# Patient Record
Sex: Female | Born: 1975 | Race: White | Hispanic: No | Marital: Single | Smoking: Current every day smoker
Health system: Southern US, Community
[De-identification: ages and names within clinical notes are randomized; demographics above are authoritative.]

## PROBLEM LIST (undated history)

## (undated) DIAGNOSIS — N920 Excessive and frequent menstruation with regular cycle: Secondary | ICD-10-CM

## (undated) DIAGNOSIS — Z87898 Personal history of other specified conditions: Secondary | ICD-10-CM

## (undated) DIAGNOSIS — S83519A Sprain of anterior cruciate ligament of unspecified knee, initial encounter: Secondary | ICD-10-CM

## (undated) DIAGNOSIS — N83209 Unspecified ovarian cyst, unspecified side: Secondary | ICD-10-CM

## (undated) DIAGNOSIS — Z8041 Family history of malignant neoplasm of ovary: Secondary | ICD-10-CM

## (undated) DIAGNOSIS — S83249A Other tear of medial meniscus, current injury, unspecified knee, initial encounter: Secondary | ICD-10-CM

## (undated) DIAGNOSIS — Z87440 Personal history of urinary (tract) infections: Secondary | ICD-10-CM

## (undated) DIAGNOSIS — D509 Iron deficiency anemia, unspecified: Secondary | ICD-10-CM

## (undated) HISTORY — DX: Personal history of other specified conditions: Z87.898

## (undated) HISTORY — PX: NO PAST SURGERIES: SHX2092

## (undated) HISTORY — DX: Family history of malignant neoplasm of ovary: Z80.41

---

## 2008-05-24 ENCOUNTER — Ambulatory Visit: Payer: Self-pay | Admitting: Family Medicine

## 2012-01-01 ENCOUNTER — Emergency Department: Payer: Self-pay | Admitting: Emergency Medicine

## 2012-11-22 ENCOUNTER — Emergency Department: Payer: Self-pay | Admitting: Emergency Medicine

## 2012-12-18 ENCOUNTER — Ambulatory Visit: Payer: Self-pay | Admitting: Specialist

## 2014-02-16 ENCOUNTER — Encounter: Payer: Self-pay | Admitting: Family Medicine

## 2014-02-16 ENCOUNTER — Ambulatory Visit (INDEPENDENT_AMBULATORY_CARE_PROVIDER_SITE_OTHER): Payer: BC Managed Care – PPO | Admitting: Family Medicine

## 2014-02-16 VITALS — BP 124/82 | HR 85 | Temp 98.8°F | Ht 64.25 in | Wt 158.8 lb

## 2014-02-16 DIAGNOSIS — M25569 Pain in unspecified knee: Secondary | ICD-10-CM

## 2014-02-16 DIAGNOSIS — Z8742 Personal history of other diseases of the female genital tract: Secondary | ICD-10-CM | POA: Insufficient documentation

## 2014-02-16 DIAGNOSIS — F17200 Nicotine dependence, unspecified, uncomplicated: Secondary | ICD-10-CM

## 2014-02-16 DIAGNOSIS — Z23 Encounter for immunization: Secondary | ICD-10-CM

## 2014-02-16 DIAGNOSIS — M25562 Pain in left knee: Secondary | ICD-10-CM | POA: Insufficient documentation

## 2014-02-16 MED ORDER — MELOXICAM 15 MG PO TABS: 15.0000 mg | ORAL_TABLET | Freq: Every day | ORAL | Status: DC

## 2014-02-16 NOTE — Progress Notes (Signed)
Subjective:    Patient ID: Ann Cox, female    DOB: 04-16-1976, 38 y.o.   MRN: [id number]  HPI Here to get est as new pt   She is a Scientist, research (medical)  She works for [employer]- down the road  Has not had a regular doctor in a while   She sees gyn at [place]   Has hx of depression  Was on cymbalta for 4 years - stopped it 8 mo ago and doing ok / saw a psychiatrist through [facility]   Has torn cartilage in L knee-has seen 2 orthopedic doctors   (has torn meniscus and also med collateral ligament?)- she needs surgery  The problem is she cannot be out of work for that long  It is getting worse with time  Original injury 2years ago - stepped in a whole  She really wants to put off the surgery -cannot afford it in addition to the time off work  A real problem - she stands for her job  She takes ibuprofen - otc 2-3 at a time perhaps 2-3 times per day  She wants stronger pain med    Pap- was 2 mo ago  Sees gyn - has hx of ovarian cysts  Her gyn did 2 saline ultrasounds less than a month ago  No children currently - and not sure if she will in the future  A hysterectomy was recommendation -once again she cannot afford surgery or the time off work for one     Td 2001-will update that today   Flu vaccine - did not get flu vaccine - she declines them   Smoking - 20 years 3/4 of a pack per day  Drinks a beer a week  Never tried to quit Not ready quit at this time She has tried the patches in the past and never used them   Has not been able to exercise due to her knee  Her Dr at [place] [facility] does her lab panel for wellness No hx of high chol or sugar   There are no active problems to display for this patient.  Past Medical History  Diagnosis Date  . History of chicken pox   . History of depression   . History of UTI    No past surgical history on file. History  Substance Use Topics  . Smoking status: Current Every Day Smoker -- 0.75 packs/day    Types:  Cigarettes  . Smokeless tobacco: Not on file  . Alcohol Use: Yes     Comment: occ   Family History  Problem Relation Age of Onset  . Adopted: Yes  . Alcohol abuse Maternal Uncle   . Alcohol abuse Paternal Uncle   . Arthritis Maternal Grandmother   . Rheum arthritis Paternal Uncle   . Arthritis Mother   . Arthritis Father   . Uterine cancer Maternal Aunt   . Hyperlipidemia Father   . Hyperlipidemia Maternal Grandfather   . Heart disease Maternal Grandmother   . Hypertension Mother   . Hypertension Father   . Anxiety disorder Mother   . Anxiety disorder Maternal Aunt   . Anxiety disorder Maternal Grandmother   . Depression Other     on mothers side   No Known Allergies No current outpatient prescriptions on file prior to visit.   No current facility-administered medications on file prior to visit.     Review of Systems Review of Systems  Constitutional: Negative for fever, appetite change,  fatigue and unexpected weight change.  Eyes: Negative for pain and visual disturbance.  Respiratory: Negative for cough and shortness of breath.   Cardiovascular: Negative for cp or palpitations    Gastrointestinal: Negative for nausea, diarrhea and constipation.  Genitourinary: Negative for urgency and frequency. pos for intermittent pelvic pain from ov cysts Skin: Negative for pallor or rash   MSK pos for severe knee pain and limited rom  Neurological: Negative for weakness, light-headedness, numbness and headaches.  Hematological: Negative for adenopathy. Does not bruise/bleed easily.  Psychiatric/Behavioral: Negative for dysphoric mood. The patient is not nervous/anxious.         Objective:   Physical Exam  Constitutional: She appears well-developed and well-nourished. No distress.  HENT:  Head: Normocephalic and atraumatic.  Mouth/Throat: Oropharynx is clear and moist.  Eyes: Conjunctivae and EOM are normal. Pupils are equal, round, and reactive to light. Right eye exhibits  no discharge. Left eye exhibits no discharge. No scleral icterus.  Neck: Normal range of motion. Neck supple. No JVD present. No thyromegaly present.  Cardiovascular: Normal rate, regular rhythm, normal heart sounds and intact distal pulses.  Exam reveals no gallop.   Pulmonary/Chest: Effort normal and breath sounds normal. No respiratory distress. She has no wheezes. She has no rales.  bs are slightly distant  Abdominal: Soft. Bowel sounds are normal. She exhibits no distension and no mass. There is no tenderness.  No suprapubic tenderness or fullness    Musculoskeletal: She exhibits tenderness. She exhibits no edema.  Unable to flex L knee more than about 50 deg due to pain  Medially tender  Puffy but no notable effusion  Gait is affected   Lymphadenopathy:    She has no cervical adenopathy.  Neurological: She is alert. She has normal reflexes. No cranial nerve deficit. She exhibits normal muscle tone. Coordination normal.  Skin: Skin is warm and dry. No rash noted. No erythema. No pallor.  Psychiatric: She has a normal mood and affect.          Assessment & Plan:

## 2014-02-16 NOTE — Progress Notes (Signed)
Pre visit review using our clinic review tool, if applicable. No additional management support is needed unless otherwise documented below in the visit note. 

## 2014-02-16 NOTE — Patient Instructions (Signed)
Tetanus vaccine today (Tdap)  Try the meloxicam for knee pain and swelling one pill daily with a meal  Tylenol is ok when you need it  - but do not mix with  other anti inflammatories (like ibuprofen and aleve) Please make an appointment with Dr Lorelei Pont at check out - he is our sport medicine doctor  Please send for the following records:   Last note and pap from [facility] gyn   Last labs from [place] note and imaging from Dr Lisette Grinder

## 2014-02-17 DIAGNOSIS — F17200 Nicotine dependence, unspecified, uncomplicated: Secondary | ICD-10-CM | POA: Insufficient documentation

## 2014-02-17 NOTE — Assessment & Plan Note (Signed)
Disc in detail risks of smoking and possible outcomes including copd, vascular/ heart disease, cancer , respiratory and sinus infections  Pt voices understanding  Pt is not ready to quit  

## 2014-02-17 NOTE — Assessment & Plan Note (Addendum)
Per pt - meniscal and ligament tear s/p 2 ortho opinions and needs surgery - but cannot afford surgery or time off work  Px meloxicam to use in place of ibuprofen  Adv tylenol as needed  Pt seemed disappointed not to get a "stronger" pain medicine  She is interested in a sport med ref to see if anything can be done non surgically to help pain Sent for last ortho records and imaging

## 2014-02-17 NOTE — Assessment & Plan Note (Signed)
Pt cannot take OC due to smoking  Sent for last gyn note and labs

## 2014-03-09 ENCOUNTER — Encounter: Payer: Self-pay | Admitting: Family Medicine

## 2014-03-09 ENCOUNTER — Ambulatory Visit (INDEPENDENT_AMBULATORY_CARE_PROVIDER_SITE_OTHER): Payer: BC Managed Care – PPO | Admitting: Family Medicine

## 2014-03-09 VITALS — BP 124/70 | HR 90 | Temp 98.5°F | Ht 64.25 in | Wt 159.5 lb

## 2014-03-09 DIAGNOSIS — S83419A Sprain of medial collateral ligament of unspecified knee, initial encounter: Secondary | ICD-10-CM

## 2014-03-09 DIAGNOSIS — IMO0002 Reserved for concepts with insufficient information to code with codable children: Secondary | ICD-10-CM

## 2014-03-09 DIAGNOSIS — S83509A Sprain of unspecified cruciate ligament of unspecified knee, initial encounter: Secondary | ICD-10-CM

## 2014-03-09 DIAGNOSIS — S83519A Sprain of anterior cruciate ligament of unspecified knee, initial encounter: Secondary | ICD-10-CM

## 2014-03-09 DIAGNOSIS — S83207A Unspecified tear of unspecified meniscus, current injury, left knee, initial encounter: Secondary | ICD-10-CM

## 2014-03-09 DIAGNOSIS — M25569 Pain in unspecified knee: Secondary | ICD-10-CM

## 2014-03-09 NOTE — Patient Instructions (Signed)
REFERRALS TO SPECIALISTS, SPECIAL TESTS (MRI, CT, ULTRASOUNDS)  GO THE WAITING ROOM AND TELL CHECK IN YOU NEED HELP WITH A REFERRAL. Either MARION or LINDA will help you set it up.  If it is between 1-2 PM they may be at lunch.  After 5 PM, they will likely be at home.  They will call you, so please make sure the office has your correct phone number.  Referrals sometimes can be done same day if urgent, but others can take 2 or 3 days to get an appointment. Starting in 2015, many of the new Medicare insurance plans and Affordable Care Act (Obamacare) Health plans offered take much longer for referrals. They have added additional paperwork and steps.  MRI's and CT's can take up to a week for the test. (Emergencies like strokes take precedence. I will tell you if you have an emergency.)   If your referral is to an in-network [facility] office, their office may contact you directly prior to our office reaching you.  -- Examples: [facility], [facility], [facility], [facility], [facility], and many more.  Specialist appointment times vary a great deal, mostly on the specialist's schedule and if they have openings. -- Our office tries to get you in as fast as possible. -- Some specialists have very long wait times. (Example. Dermatology. Usually months) -- If you have a true emergency like new cancer, we work to get you in ASAP.   

## 2014-03-09 NOTE — Progress Notes (Signed)
Pre visit review using our clinic review tool, if applicable. No additional management support is needed unless otherwise documented below in the visit note. 

## 2014-03-09 NOTE — Progress Notes (Signed)
Date:  03/09/2014   Name:  Ann Cox   DOB:  1976/04/01   MRN:  [id number]  Primary Physician: Loura Pardon, MD  Dear Dr. Glori Bickers,  Thank you for having me see Ann Cox in consultation today at [facility] at [facility] for her problem with [place].  As you may recall, she is a 38 y.o. year old female with a history of left knee pain over approaching 18 months. The patient was stepping in a hole at that time, and then she felt a significant amount of pain in her knee.  She saw initially Dr. Norville Haggard and the his partner Dr. Tamala Julian at [facility] She had a set of x-rays which she reports are unremarkable, and additionally she had an MRI of her left knee, and she had an ACL tear as well as a large bucket-handle  meniscal tear. She describes this as a cartilage tear.  She is still had some significant amount of intermittent effusions. Her knee is locking up intermittently and she has mechanical symptoms much of the time.  Both of the above surgeons recommended operative intervention, but they stated that she likely would be out of work for 6 months. Having now reviewed the notes, this has to do with her and the recommendation for anterior cruciate ligament reconstruction.   12/2012 - saw Ann Cox and also saw Dr. Norville Haggard.  Past Medical History  Diagnosis Date  . History of chicken pox   . History of depression   . History of UTI     No past surgical history on file.  History   Social History  . Marital Status: Single    Spouse Name: N/A    Number of Children: N/A  . Years of Education: N/A   Social History Main Topics  . Smoking status: Current Every Day Smoker -- 0.75 packs/day    Types: Cigarettes  . Smokeless tobacco: Never Used  . Alcohol Use: Yes     Comment: occ  . Drug Use: No  . Sexual Activity: None   Other Topics Concern  . None   Social History Narrative  . None    Family History  Problem Relation Age of Onset  . Adopted:  Yes  . Alcohol abuse Maternal Uncle   . Alcohol abuse Paternal Uncle   . Arthritis Maternal Grandmother   . Rheum arthritis Paternal Uncle   . Arthritis Mother   . Arthritis Father   . Uterine cancer Maternal Aunt   . Hyperlipidemia Father   . Hyperlipidemia Maternal Grandfather   . Heart disease Maternal Grandmother   . Hypertension Mother   . Hypertension Father   . Anxiety disorder Mother   . Anxiety disorder Maternal Aunt   . Anxiety disorder Maternal Grandmother   . Depression Other     on mothers side    Medications and Allergies reviewed  Review of Systems:    GEN: No fevers, chills. Nontoxic. Primarily MSK c/o today. MSK: Detailed in the HPI GI: tolerating PO intake without difficulty Neuro: No numbness, parasthesias, or tingling associated. Otherwise the pertinent positives of the ROS are noted above.   Physical Examination: BP 124/70  Pulse 90  Temp(Src) 98.5 F (36.9 C) (Oral)  Ht 5' 4.25" (1.632 m)  Wt 159 lb 8 oz (72.349 kg)  BMI 27.16 kg/m2  LMP 03/07/2014    GEN: WDWN, NAD, Non-toxic, Alert & Oriented x 3 HEENT: Atraumatic, Normocephalic.  Ears and Nose: No external deformity. EXTR: No  clubbing/cyanosis/edema NEURO: Normal gait.  PSYCH: Normally interactive. Conversant. Not depressed or anxious appearing.  Calm demeanor.    Left knee: The patient lacks 2 of extension. Flexion to 130. She does have a moderate ballotable effusion. There is some tenderness on the medial joint line greater than the lateral joint line.  All Lachman examination, there is an approximate 5 mm translation anteriorly compared to the contralateral side. This is also felt on the anterior drawer test. Posterior drawer test is negative.  McMurray's test is positive. Flexion pinch test is positive. Bounce home test is positive.  Objective Data: No results found.  Assessment and Plan: 1. Large bucket handle meniscal tear. 2. Left ACL rupture 3. Ongoing, routine mechanical  symptoms  Recommendations: 1. For now, ice and anti-inflammatories. Recommended that she wear her hinged knee brace, which sounds to be a playmaker brace equivalent. 2. Recommend surgical consultation. The patient has mechanical symptoms with a large meniscal tear on the left knee. She also now has an anterior cruciate ligament deficient knee. 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 She may do okay without anterior cruciate ligament reconstruction. I advised her that it would be certainly reasonable to have a second opinion on this case from a surgical standpoint. In my opinion, operative intervention would be the primary management in this case. I am going to consult Dr. Mardelle Matte for his opinion.   We will see the patient back prn only.  Thank you for having [place] see Ann Cox in consultation.  Feel free to contact me with any questions.  Updated Medication List:   Medication List       This list is accurate as of: 03/09/14 11:59 PM.  Always use your most recent med list.               meloxicam 15 MG tablet  Commonly known as:  MOBIC  Take 1 tablet (15 mg total) by mouth daily. Take it with food        Signed,  Isais Klipfel T. Youa Deloney, MD, [place] at [facility] [address] [place], [place] [address] Phone: [phone] Fax: [phone]

## 2014-03-10 DIAGNOSIS — S83519A Sprain of anterior cruciate ligament of unspecified knee, initial encounter: Secondary | ICD-10-CM

## 2014-03-10 HISTORY — DX: Sprain of anterior cruciate ligament of unspecified knee, initial encounter: S83.519A

## 2015-02-07 DIAGNOSIS — S83519A Sprain of anterior cruciate ligament of unspecified knee, initial encounter: Secondary | ICD-10-CM

## 2015-02-07 DIAGNOSIS — S83249A Other tear of medial meniscus, current injury, unspecified knee, initial encounter: Secondary | ICD-10-CM

## 2015-02-07 HISTORY — DX: Sprain of anterior cruciate ligament of unspecified knee, initial encounter: S83.519A

## 2015-02-07 HISTORY — DX: Other tear of medial meniscus, current injury, unspecified knee, initial encounter: S83.249A

## 2015-02-24 ENCOUNTER — Other Ambulatory Visit: Payer: Self-pay | Admitting: Orthopedic Surgery

## 2015-03-06 ENCOUNTER — Encounter (HOSPITAL_BASED_OUTPATIENT_CLINIC_OR_DEPARTMENT_OTHER): Payer: Self-pay | Admitting: *Deleted

## 2015-03-10 ENCOUNTER — Encounter (HOSPITAL_BASED_OUTPATIENT_CLINIC_OR_DEPARTMENT_OTHER)
Admission: RE | Disposition: A | Discharge status: Home / Self Care | Payer: Self-pay | Source: Ambulatory Visit | Attending: Orthopedic Surgery

## 2015-03-10 ENCOUNTER — Ambulatory Visit (HOSPITAL_BASED_OUTPATIENT_CLINIC_OR_DEPARTMENT_OTHER)
Admission: RE | Admit: 2015-03-10 | Discharge: 2015-03-10 | Disposition: A | Discharge status: Home / Self Care | Payer: BLUE CROSS/BLUE SHIELD | Source: Ambulatory Visit | Attending: Orthopedic Surgery | Admitting: Orthopedic Surgery

## 2015-03-10 ENCOUNTER — Ambulatory Visit (HOSPITAL_BASED_OUTPATIENT_CLINIC_OR_DEPARTMENT_OTHER): Payer: BLUE CROSS/BLUE SHIELD | Admitting: Certified Registered"

## 2015-03-10 ENCOUNTER — Encounter (HOSPITAL_BASED_OUTPATIENT_CLINIC_OR_DEPARTMENT_OTHER): Payer: Self-pay

## 2015-03-10 DIAGNOSIS — S83242A Other tear of medial meniscus, current injury, left knee, initial encounter: Secondary | ICD-10-CM | POA: Diagnosis not present

## 2015-03-10 DIAGNOSIS — S83512A Sprain of anterior cruciate ligament of left knee, initial encounter: Secondary | ICD-10-CM | POA: Insufficient documentation

## 2015-03-10 DIAGNOSIS — Y9389 Activity, other specified: Secondary | ICD-10-CM | POA: Insufficient documentation

## 2015-03-10 DIAGNOSIS — Y9289 Other specified places as the place of occurrence of the external cause: Secondary | ICD-10-CM | POA: Diagnosis not present

## 2015-03-10 DIAGNOSIS — Z87440 Personal history of urinary (tract) infections: Secondary | ICD-10-CM | POA: Diagnosis not present

## 2015-03-10 DIAGNOSIS — F17210 Nicotine dependence, cigarettes, uncomplicated: Secondary | ICD-10-CM | POA: Diagnosis not present

## 2015-03-10 DIAGNOSIS — X58XXXA Exposure to other specified factors, initial encounter: Secondary | ICD-10-CM | POA: Diagnosis not present

## 2015-03-10 DIAGNOSIS — Y998 Other external cause status: Secondary | ICD-10-CM | POA: Insufficient documentation

## 2015-03-10 DIAGNOSIS — S83519A Sprain of anterior cruciate ligament of unspecified knee, initial encounter: Secondary | ICD-10-CM | POA: Diagnosis present

## 2015-03-10 HISTORY — DX: Other tear of medial meniscus, current injury, unspecified knee, initial encounter: S83.249A

## 2015-03-10 HISTORY — DX: Sprain of anterior cruciate ligament of unspecified knee, initial encounter: S83.519A

## 2015-03-10 HISTORY — DX: Personal history of urinary (tract) infections: Z87.440

## 2015-03-10 HISTORY — PX: KNEE ARTHROSCOPY WITH MEDIAL MENISECTOMY: SHX5651

## 2015-03-10 HISTORY — PX: ARTHROSCOPY WITH ANTERIOR CRUCIATE LIGAMENT (ACL) REPAIR WITH ANTERIOR TIBILIAS GRAFT: SHX6503

## 2015-03-10 LAB — POCT HEMOGLOBIN-HEMACUE: Hemoglobin: 12.2 g/dL (ref 12.0–15.0)

## 2015-03-10 SURGERY — KNEE ARTHROSCOPY WITH ANTERIOR CRUCIATE LIGAMENT (ACL) REPAIR WITH ANTERIOR TIBILIAS GRAFT
Anesthesia: Regional | Site: Knee | Laterality: Left

## 2015-03-10 MED ORDER — KETOROLAC TROMETHAMINE 30 MG/ML IJ SOLN
30.0000 mg | Freq: Once | INTRAMUSCULAR | Status: AC
  Administered 2015-03-10: 30 mg via INTRAVENOUS

## 2015-03-10 MED ORDER — FENTANYL CITRATE 0.05 MG/ML IJ SOLN
INTRAMUSCULAR | Status: DC | PRN
  Administered 2015-03-10: 25 ug via INTRAVENOUS
  Administered 2015-03-10: 100 ug via INTRAVENOUS
  Administered 2015-03-10: 50 ug via INTRAVENOUS
  Administered 2015-03-10: 25 ug via INTRAVENOUS

## 2015-03-10 MED ORDER — HYDROMORPHONE HCL 1 MG/ML IJ SOLN
0.2500 mg | INTRAMUSCULAR | Status: DC | PRN
  Administered 2015-03-10 (×3): 0.5 mg via INTRAVENOUS

## 2015-03-10 MED ORDER — DEXAMETHASONE SODIUM PHOSPHATE 10 MG/ML IJ SOLN
INTRAMUSCULAR | Status: DC | PRN
  Administered 2015-03-10: 10 mg via INTRAVENOUS

## 2015-03-10 MED ORDER — OXYCODONE HCL 5 MG PO TABS
ORAL_TABLET | ORAL | Status: AC
Start: 2015-03-10 — End: 2015-03-10
  Filled 2015-03-10: qty 1

## 2015-03-10 MED ORDER — FENTANYL CITRATE 0.05 MG/ML IJ SOLN
INTRAMUSCULAR | Status: AC
  Filled 2015-03-10: qty 8

## 2015-03-10 MED ORDER — SENNA-DOCUSATE SODIUM 8.6-50 MG PO TABS: 2.0000 | ORAL_TABLET | Freq: Every day | ORAL | Status: DC

## 2015-03-10 MED ORDER — KETOROLAC TROMETHAMINE 30 MG/ML IJ SOLN
INTRAMUSCULAR | Status: AC
  Filled 2015-03-10: qty 1

## 2015-03-10 MED ORDER — HYDROMORPHONE HCL 1 MG/ML IJ SOLN
INTRAMUSCULAR | Status: AC
  Filled 2015-03-10: qty 1

## 2015-03-10 MED ORDER — BUPIVACAINE-EPINEPHRINE (PF) 0.5% -1:200000 IJ SOLN
INTRAMUSCULAR | Status: DC | PRN
  Administered 2015-03-10: 30 mL via PERINEURAL

## 2015-03-10 MED ORDER — ONDANSETRON HCL 4 MG/2ML IJ SOLN: 4.0000 mg | Freq: Four times a day (QID) | INTRAMUSCULAR | Status: DC | PRN

## 2015-03-10 MED ORDER — MIDAZOLAM HCL 2 MG/2ML IJ SOLN
INTRAMUSCULAR | Status: AC
  Filled 2015-03-10: qty 2

## 2015-03-10 MED ORDER — MIDAZOLAM HCL 2 MG/2ML IJ SOLN
1.0000 mg | INTRAMUSCULAR | Status: DC | PRN
  Administered 2015-03-10: 2 mg via INTRAVENOUS

## 2015-03-10 MED ORDER — OXYCODONE-ACETAMINOPHEN 5-325 MG PO TABS: 1.0000 | ORAL_TABLET | Freq: Four times a day (QID) | ORAL | Status: DC | PRN

## 2015-03-10 MED ORDER — BUPIVACAINE HCL (PF) 0.25 % IJ SOLN
INTRAMUSCULAR | Status: AC
  Filled 2015-03-10: qty 30

## 2015-03-10 MED ORDER — BACLOFEN 10 MG PO TABS: 10.0000 mg | ORAL_TABLET | Freq: Three times a day (TID) | ORAL | Status: DC

## 2015-03-10 MED ORDER — LIDOCAINE HCL (CARDIAC) 20 MG/ML IV SOLN
INTRAVENOUS | Status: DC | PRN
  Administered 2015-03-10: 30 mg via INTRAVENOUS

## 2015-03-10 MED ORDER — PROPOFOL 10 MG/ML IV BOLUS
INTRAVENOUS | Status: DC | PRN
  Administered 2015-03-10: 200 mg via INTRAVENOUS

## 2015-03-10 MED ORDER — ONDANSETRON HCL 4 MG PO TABS: 4.0000 mg | ORAL_TABLET | Freq: Three times a day (TID) | ORAL | Status: DC | PRN

## 2015-03-10 MED ORDER — FENTANYL CITRATE 0.05 MG/ML IJ SOLN
50.0000 ug | INTRAMUSCULAR | Status: DC | PRN
  Administered 2015-03-10: 100 ug via INTRAVENOUS

## 2015-03-10 MED ORDER — BUPIVACAINE-EPINEPHRINE (PF) 0.25% -1:200000 IJ SOLN
INTRAMUSCULAR | Status: AC
  Filled 2015-03-10: qty 30

## 2015-03-10 MED ORDER — CEFAZOLIN SODIUM-DEXTROSE 2-3 GM-% IV SOLR
2.0000 g | INTRAVENOUS | Status: AC
  Administered 2015-03-10: 2 g via INTRAVENOUS

## 2015-03-10 MED ORDER — OXYCODONE HCL 5 MG/5ML PO SOLN: 5.0000 mg | Freq: Once | ORAL | Status: AC | PRN

## 2015-03-10 MED ORDER — OXYCODONE HCL 5 MG PO TABS
5.0000 mg | ORAL_TABLET | Freq: Once | ORAL | Status: AC | PRN
  Administered 2015-03-10: 5 mg via ORAL

## 2015-03-10 MED ORDER — SODIUM CHLORIDE 0.9 % IR SOLN
Status: DC | PRN
  Administered 2015-03-10: 9000 mL

## 2015-03-10 MED ORDER — LACTATED RINGERS IV SOLN
INTRAVENOUS | Status: DC
  Administered 2015-03-10 (×2): via INTRAVENOUS
  Administered 2015-03-10: 10 mL/h via INTRAVENOUS

## 2015-03-10 MED ORDER — ONDANSETRON HCL 4 MG/2ML IJ SOLN
INTRAMUSCULAR | Status: DC | PRN
  Administered 2015-03-10: 4 mg via INTRAVENOUS

## 2015-03-10 MED ORDER — FENTANYL CITRATE 0.05 MG/ML IJ SOLN
INTRAMUSCULAR | Status: AC
  Filled 2015-03-10: qty 2

## 2015-03-10 MED ORDER — MIDAZOLAM HCL 5 MG/5ML IJ SOLN
INTRAMUSCULAR | Status: DC | PRN
  Administered 2015-03-10: 2 mg via INTRAVENOUS

## 2015-03-10 SURGICAL SUPPLY — 80 items
ANCHOR BUTTON TIGHTROPE ACL RT (Orthopedic Implant) ×3 IMPLANT
BANDAGE ELASTIC 6 VELCRO ST LF (GAUZE/BANDAGES/DRESSINGS) IMPLANT
BANDAGE ESMARK 6X9 LF (GAUZE/BANDAGES/DRESSINGS) IMPLANT
BLADE 4.2CUDA (BLADE) IMPLANT
BLADE CUDA GRT WHITE 3.5 (BLADE) ×3 IMPLANT
BLADE CUDA SHAVER 3.5 (BLADE) IMPLANT
BLADE CUTTER GATOR 3.5 (BLADE) IMPLANT
BLADE GREAT WHITE 4.2 (BLADE) IMPLANT
BLADE GREAT WHITE 4.2MM (BLADE)
BLADE SURG 15 STRL LF DISP TIS (BLADE) ×1 IMPLANT
BLADE SURG 15 STRL SS (BLADE) ×2
BNDG ESMARK 6X9 LF (GAUZE/BANDAGES/DRESSINGS)
BUR OVAL 6.0 (BURR) IMPLANT
CLOSURE STERI-STRIP 1/2X4 (GAUZE/BANDAGES/DRESSINGS) ×1
CLSR STERI-STRIP ANTIMIC 1/2X4 (GAUZE/BANDAGES/DRESSINGS) ×2 IMPLANT
COVER BACK TABLE 60X90IN (DRAPES) ×3 IMPLANT
CUFF TOURNIQUET SINGLE 34IN LL (TOURNIQUET CUFF) ×3 IMPLANT
CUTTER FLIP II 9.5MM (INSTRUMENTS) ×3 IMPLANT
CUTTER MENISCUS  4.2MM (BLADE)
CUTTER MENISCUS 4.2MM (BLADE) IMPLANT
DECANTER SPIKE VIAL GLASS SM (MISCELLANEOUS) IMPLANT
DRAPE ARTHROSCOPY W/POUCH 90 (DRAPES) ×3 IMPLANT
DRAPE OEC MINIVIEW 54X84 (DRAPES) ×6 IMPLANT
DRAPE U 20/CS (DRAPES) ×3 IMPLANT
DRAPE U-SHAPE 47X51 STRL (DRAPES) ×3 IMPLANT
DURAPREP 26ML APPLICATOR (WOUND CARE) ×3 IMPLANT
ELECT REM PT RETURN 9FT ADLT (ELECTROSURGICAL) ×3
ELECTRODE REM PT RTRN 9FT ADLT (ELECTROSURGICAL) ×1 IMPLANT
FIBERSTICK 2 (SUTURE) ×3 IMPLANT
GAUZE SPONGE 4X4 12PLY STRL (GAUZE/BANDAGES/DRESSINGS) ×3 IMPLANT
GLOVE BIO SURGEON STRL SZ 6.5 (GLOVE) ×4 IMPLANT
GLOVE BIO SURGEON STRL SZ8 (GLOVE) ×3 IMPLANT
GLOVE BIO SURGEONS STRL SZ 6.5 (GLOVE) ×2
GLOVE BIOGEL PI IND STRL 7.0 (GLOVE) ×1 IMPLANT
GLOVE BIOGEL PI IND STRL 8 (GLOVE) ×2 IMPLANT
GLOVE BIOGEL PI INDICATOR 7.0 (GLOVE) ×2
GLOVE BIOGEL PI INDICATOR 8 (GLOVE) ×4
GLOVE ORTHO TXT STRL SZ7.5 (GLOVE) ×3 IMPLANT
GOWN STRL REUS W/ TWL LRG LVL3 (GOWN DISPOSABLE) ×1 IMPLANT
GOWN STRL REUS W/ TWL XL LVL3 (GOWN DISPOSABLE) ×2 IMPLANT
GOWN STRL REUS W/TWL LRG LVL3 (GOWN DISPOSABLE) ×2
GOWN STRL REUS W/TWL XL LVL3 (GOWN DISPOSABLE) ×4
IMMOBILIZER KNEE 22 UNIV (SOFTGOODS) ×3 IMPLANT
IMMOBILIZER KNEE 24 THIGH 36 (MISCELLANEOUS) ×1 IMPLANT
IMMOBILIZER KNEE 24 UNIV (MISCELLANEOUS) ×3
IV NS IRRIG 3000ML ARTHROMATIC (IV SOLUTION) ×6 IMPLANT
KIT TRANSTIBIAL (DISPOSABLE) ×3 IMPLANT
KNEE WRAP E Z 3 GEL PACK (MISCELLANEOUS) ×3 IMPLANT
MANIFOLD NEPTUNE II (INSTRUMENTS) ×3 IMPLANT
NS IRRIG 1000ML POUR BTL (IV SOLUTION) ×3 IMPLANT
PACK ARTHROSCOPY DSU (CUSTOM PROCEDURE TRAY) ×3 IMPLANT
PACK BASIN DAY SURGERY FS (CUSTOM PROCEDURE TRAY) ×3 IMPLANT
PAD CAST 4YDX4 CTTN HI CHSV (CAST SUPPLIES) ×1 IMPLANT
PADDING CAST COTTON 4X4 STRL (CAST SUPPLIES) ×2
PADDING CAST COTTON 6X4 STRL (CAST SUPPLIES) ×3 IMPLANT
PENCIL BUTTON HOLSTER BLD 10FT (ELECTRODE) IMPLANT
SCREW BIO INTER 9X28 (Screw) ×3 IMPLANT
SET ARTHROSCOPY TUBING (MISCELLANEOUS) ×2
SET ARTHROSCOPY TUBING LN (MISCELLANEOUS) ×1 IMPLANT
SLEEVE SCD COMPRESS KNEE MED (MISCELLANEOUS) ×3 IMPLANT
SPONGE LAP 4X18 X RAY DECT (DISPOSABLE) ×3 IMPLANT
SUCTION FRAZIER TIP 10 FR DISP (SUCTIONS) ×3 IMPLANT
SUT 2 FIBERLOOP 20 STRT BLUE (SUTURE) ×6
SUT FIBERWIRE #2 38 T-5 BLUE (SUTURE)
SUT MNCRL AB 4-0 PS2 18 (SUTURE) ×3 IMPLANT
SUT VIC AB 0 CT1 27 (SUTURE)
SUT VIC AB 0 CT1 27XBRD ANBCTR (SUTURE) IMPLANT
SUT VIC AB 2-0 SH 27 (SUTURE)
SUT VIC AB 2-0 SH 27XBRD (SUTURE) IMPLANT
SUT VIC AB 3-0 SH 27 (SUTURE)
SUT VIC AB 3-0 SH 27X BRD (SUTURE) IMPLANT
SUT VICRYL 3-0 CR8 SH (SUTURE) IMPLANT
SUT VICRYL 4-0 PS2 18IN ABS (SUTURE) IMPLANT
SUTURE 2 FIBERLOOP 20 STRT BLU (SUTURE) ×2 IMPLANT
SUTURE FIBERWR #2 38 T-5 BLUE (SUTURE) IMPLANT
TENDON ANTERIOR TIBIALIS (Tissue) ×3 IMPLANT
TOWEL OR 17X24 6PK STRL BLUE (TOWEL DISPOSABLE) ×3 IMPLANT
TOWEL OR NON WOVEN STRL DISP B (DISPOSABLE) ×3 IMPLANT
WAND STAR VAC 90 (SURGICAL WAND) ×3 IMPLANT
WATER STERILE IRR 1000ML POUR (IV SOLUTION) ×3 IMPLANT

## 2015-03-10 NOTE — Transfer of Care (Signed)
Immediate Anesthesia Transfer of Care Note  Patient: Ann Cox  Procedure(s) Performed: Procedure(s): LEFT KNEE ARTHROSCOPY MEDIAL MENISCECTOMY  ANTERIOR CRUCIATE LIGAMENT (ACL) TIBIAL ANTERIOR ALLOGRAFT (Left)  Patient Location: PACU  Anesthesia Type:GA combined with regional for post-op pain  Level of Consciousness: awake, alert , oriented and patient cooperative  Airway & Oxygen Therapy: Patient Spontanous Breathing and Patient connected to face mask oxygen  Post-op Assessment: Report given to RN and Post -op Vital signs reviewed and stable  Post vital signs: Reviewed and stable  Last Vitals:  Filed Vitals:   03/10/15 0925  BP:   Pulse:   Temp:   Resp: 20    Complications: No apparent anesthesia complications

## 2015-03-10 NOTE — Progress Notes (Signed)
Assisted Dr. Marcie Bal with left, ultrasound guided, femoral block. Side rails up, monitors on throughout procedure. See vital signs in flow sheet. Tolerated Procedure well.

## 2015-03-10 NOTE — H&P (Signed)
PREOPERATIVE H&P  Chief Complaint: Left knee instability and pain  HPI: Ann Cox is a 39 y.o. female who presents for preoperative history and physical with a diagnosis of left anterior cruciate ligament tear with bucket handle medial meniscus tear. Symptoms are rated as moderate to severe, and have been worsening.  This is significantly impairing activities of daily living.  She has elected for surgical management. Pain gets up to be 10/10, been going on for 2-3 years, she works in retail, failed anti-inflammatories and activity modification. Recurrent episodes of instability and severe pain.  Past Medical History  Diagnosis Date  . ACL sprain 02/2015    left  . Medial meniscus tear 02/2015    left knee  . History of recurrent UTI (urinary tract infection)    Past Surgical History  Procedure Laterality Date  . No past surgeries     History   Social History  . Marital Status: Single    Spouse Name: N/A  . Number of Children: N/A  . Years of Education: N/A   Social History Main Topics  . Smoking status: Current Every Day Smoker -- 0.50 packs/day for 14 years    Types: Cigarettes  . Smokeless tobacco: Never Used  . Alcohol Use: Yes     Comment: occasionally  . Drug Use: No  . Sexual Activity: Not on file   Other Topics Concern  . None   Social History Narrative   Family History  Problem Relation Age of Onset  . Adopted: Yes  . Alcohol abuse Maternal Uncle   . Alcohol abuse Paternal Uncle   . Arthritis Maternal Grandmother   . Rheum arthritis Paternal Uncle   . Arthritis Mother   . Arthritis Father   . Uterine cancer Maternal Aunt   . Hyperlipidemia Father   . Hyperlipidemia Maternal Grandfather   . Heart disease Maternal Grandmother   . Hypertension Mother   . Hypertension Father   . Anxiety disorder Mother   . Anxiety disorder Maternal Aunt   . Anxiety disorder Maternal Grandmother    No Known Allergies Prior to Admission medications   Medication Sig  Start Date End Date Taking? Authorizing Provider  acetaminophen (TYLENOL) 325 MG tablet Take 650 mg by mouth every 6 (six) hours as needed.   Yes Historical Provider, MD     Positive ROS: All other systems have been reviewed and were otherwise negative with the exception of those mentioned in the HPI and as above.  Physical Exam: General: Alert, no acute distress Cardiovascular: No pedal edema Respiratory: No cyanosis, no use of accessory musculature GI: No organomegaly, abdomen is soft and non-tender Skin: No lesions in the area of chief complaint Neurologic: Sensation intact distally Psychiatric: Patient is competent for consent with normal mood and affect Lymphatic: No axillary or cervical lymphadenopathy  MUSCULOSKELETAL: Left knee has range of motion 0-130 with intact stability to varus and valgus, positive Lachman, medial joint line tenderness, negative lateral joint line tenderness.  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 We have also discussed the risks for recurrent instability, progression of posttraumatic arthritis, among others.  I doubt that the meniscus tear is repairable at this point, but we will assess this at the time intraoperatively.  Johnny Bridge, MD Cell [phone]   03/10/2015 7:31 AM

## 2015-03-10 NOTE — Op Note (Signed)
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  ANESTHESIA:   General  PREOPERATIVE INDICATIONS:  Ann Cox is a  39 y.o. female who tore her left anterior cruciate ligament and medial meniscus at least 3 years ago who failed conservative measures and elected for surgical management.    The risks benefits and alternatives were discussed with the patient preoperatively including but not limited to the risks of infection, bleeding, nerve injury, stiffness, cardiopulmonary complications, the need for revision surgery, recurrent instability, progression of arthritis, the potential for use of a allograft and related disease transmission risks, among others and the patient was willing to proceed.  .  OPERATIVE IMPLANTS: Arthrex anterior cruciate ligament tightrope, bio composite tibial interference screw size 9 mm and a 9.5 mm tibialis anterior allograft.  OPERATIVE FINDINGS: The anterior cruciate ligament was completely torn. The PCL was intact. The posterior lateral corner was intact to dial testing. Lateral compartment was normal. The patellofemoral joint was normal. The medial compartment meniscal findings demonstrated a large complex bucket-handle medial meniscus tear with a complex configuration into the posterior horn that was not amenable to repair. The tear extended around to the anterior horn. The medial femoral and tibial condyles were still in good condition.  OPERATIVE PROCEDURE: The patient  was brought to the operating room and placed in the supine position. General anesthesia was administered. IV antibiotics were given. The lower extremity was prepped and draped in usual sterile fashion. Exam under anesthesia demonstrated the above-named findings. Time out was performed.  Knee arthroscopy was then performed, and the above named findings were noted.     I used the arthroscopic shaver and the arthroscopic basket to remove the bucket handle complex tear, and trimmed back to a stable configuration. This was a near subtotal meniscectomy.  I then removed the previous anterior cruciate ligament stump, and performed a mild notchplasty.  The outside in guide was then applied to the appropriate position and the retro-cutter was used to drill the femoral socket. Care was taken to maintain the cortical bridge.  I then drilled the tibial tunnel using the retro-cutter, and opened the cortex with a reamer. All the soft tissue remnants were removed and cleaned at the aperture of the tunnel.she was fairly small, and the 9.5 mm graft completely filled the notch, despite a reasonable notchplasty, additionally the tibial tunnel was initially slightly more medial than I wanted it, and I repositioned this to be more central, but there really was not much room between the articular surfaces of the tibial condyles given her small stature.   I also dilated with the appropriate dilators.  The passing suture was delivered through the tibia, and then the button and graft delivered up into the femoral tunnel.  The button was flipped and confirmed under live fluoroscopy. I then tensioned the anterior cruciate ligament tightrope, and deliver the graft up into the femoral tunnel. Over 25 mm of graft was in the femoral tunnel. I confirmed once more with the fluoroscopy that the button was flipped appropriately on the femoral cortex.  I then cycled  the knee, eliminated all of the creep, and I had excellent isometry. I  then applied tension, and the Arthrex bio composite interference screw into the tibia placing a reverse Lachman maneuver on the tibia and femur. I removed the guide pin prior to completely seating the screw.  Excellent fixation was achieved on both the femoral and tibial side, and the wounds were irrigated copiously and the sartorius fascia repaired with Vicryl, and the portals repaired with Monocryl with Steri-Strips and sterile gauze.  The patient was awakened and returned to PACU in stable and satisfactory condition. There were no complications and She tolerated the procedure well.

## 2015-03-10 NOTE — Anesthesia Procedure Notes (Addendum)
Anesthesia Regional Block:  Femoral nerve block  Pre-Anesthetic Checklist: ,, timeout performed, Correct Patient, Correct Site, Correct Laterality, Correct Procedure,, site marked, risks and benefits discussed, Surgical consent,  Pre-op evaluation,  At surgeon's request and post-op pain management  Laterality: Left  Prep: chloraprep       Needles:  Injection technique: Single-shot  Needle Type: Echogenic Stimulator Needle     Needle Length: 9cm 9 cm Needle Gauge: 21 and 21 G    Additional Needles:  Procedures: nerve stimulator Femoral nerve block  Nerve Stimulator or Paresthesia:  Response: Quadriceps muscle contraction, 0.45 mA,   Additional Responses:   Narrative:  Start time: 03/10/2015 9:06 AM End time: 03/10/2015 9:18 AM Injection made incrementally with aspirations every 5 mL.  Performed by: Personally  Anesthesiologist: HODIERNE, ADAM  Additional Notes: Functioning IV was confirmed and monitors were applied.  A 27mm 21ga Arrow echogenic stimulator needle was used. Sterile prep and drape,hand hygiene and sterile gloves were used.  Negative aspiration and negative test dose prior to incremental administration of local anesthetic. The patient tolerated the procedure well.     Procedure Name: LMA Insertion Date/Time: 03/10/2015 9:36 AM Performed by: Catherine Oak D Pre-anesthesia Checklist: Patient identified, Emergency Drugs available, Suction available and Patient being monitored Patient Re-evaluated:Patient Re-evaluated prior to inductionOxygen Delivery Method: Circle System Utilized Preoxygenation: Pre-oxygenation with 100% oxygen Intubation Type: IV induction Ventilation: Mask ventilation without difficulty LMA: LMA inserted LMA Size: 4.0 Number of attempts: 1 Airway Equipment and Method: Bite block Placement Confirmation: positive ETCO2 Tube secured with: Tape Dental Injury: Teeth and Oropharynx as per pre-operative assessment

## 2015-03-10 NOTE — Anesthesia Preprocedure Evaluation (Signed)
Anesthesia Evaluation  Patient identified by MRN, date of birth, ID band Patient awake    Reviewed: Allergy & Precautions, NPO status , Patient's Chart, lab work & pertinent test results  Airway Mallampati: II   Neck ROM: full    Dental   Pulmonary Current Smoker,  breath sounds clear to auscultation        Cardiovascular negative cardio ROS  Rhythm:regular Rate:Normal     Neuro/Psych    GI/Hepatic   Endo/Other    Renal/GU      Musculoskeletal   Abdominal   Peds  Hematology   Anesthesia Other Findings   Reproductive/Obstetrics                             Anesthesia Physical Anesthesia Plan  ASA: II  Anesthesia Plan: General and Regional   Post-op Pain Management: MAC Combined w/ Regional for Post-op pain   Induction: Intravenous  Airway Management Planned: LMA  Additional Equipment:   Intra-op Plan:   Post-operative Plan:   Informed Consent: I have reviewed the patients History and Physical, chart, labs and discussed the procedure including the risks, benefits and alternatives for the proposed anesthesia with the patient or authorized representative who has indicated his/her understanding and acceptance.     Plan Discussed with: CRNA, Anesthesiologist and Surgeon  Anesthesia Plan Comments:         Anesthesia Quick Evaluation

## 2015-03-10 NOTE — Discharge Instructions (Signed)
Diet: As you were doing prior to hospitalization   Shower:  May shower but keep the wounds dry, use an occlusive plastic wrap, NO SOAKING IN TUB.  If the bandage gets wet, change with a clean dry gauze.  Dressing:  You may change your dressing 3-5 days after surgery.  Then change the dressing daily with sterile gauze dressing.    There are sticky tapes (steri-strips) on your wounds and all the stitches are absorbable.  Leave the steri-strips in place when changing your dressings, they will peel off with time, usually 2-3 weeks.  Activity:  Increase activity slowly as tolerated, but follow the weight bearing instructions below.  No lifting or driving for 6 weeks.  Weight Bearing:   As tolerated.    To prevent constipation: you may use a stool softener such as -  Colace (over the counter) 100 mg by mouth twice a day  Drink plenty of fluids (prune juice may be helpful) and high fiber foods Miralax (over the counter) for constipation as needed.    Itching:  If you experience itching with your medications, try taking only a single pain pill, or even half a pain pill at a time.  You may take up to 10 pain pills per day, and you can also use benadryl over the counter for itching or also to help with sleep.   Precautions:  If you experience chest pain or shortness of breath - call 911 immediately for transfer to the hospital emergency department!!  If you develop a fever greater that 101 F, purulent drainage from wound, increased redness or drainage from wound, or calf pain -- Call the office at [phone]                                                Follow- Up Appointment:  Please call for an appointment to be seen in 2 weeks [place] - [phone]  Regional Anesthesia Blocks  1. Numbness or the inability to move the "blocked" extremity may last from 3-48 hours after placement. The length of time depends on the medication injected and your individual response to the medication. If the  numbness is not going away after 48 hours, call your surgeon.  2. The extremity that is blocked will need to be protected until the numbness is gone and the  Strength has returned. Because you cannot feel it, you will need to take extra care to avoid injury. Because it may be weak, you may have difficulty moving it or using it. You may not know what position it is in without looking at it while the block is in effect.  3. For blocks in the legs and feet, returning to weight bearing and walking needs to be done carefully. You will need to wait until the numbness is entirely gone and the strength has returned. You should be able to move your leg and foot normally before you try and bear weight or walk. You will need someone to be with you when you first try to ensure you do not fall and possibly risk injury.  4. Bruising and tenderness at the needle site are common side effects and will resolve in a few days.  5. Persistent numbness or new problems with movement should be communicated to the surgeon or the [facility] [phone] [employer] [phone]).  Post Anesthesia Home Care Instructions  Activity: Get plenty of rest for the remainder of the day. A responsible adult should stay with you for 24 hours following the procedure.  For the next 24 hours, DO NOT: -Drive a car -Paediatric nurse -Drink alcoholic beverages -Take any medication unless instructed by your physician -Make any legal decisions or sign important papers.  Meals: Start with liquid foods such as gelatin or soup. Progress to regular foods as tolerated. Avoid greasy, spicy, heavy foods. If nausea and/or vomiting occur, drink only clear liquids until the nausea and/or vomiting subsides. Call your physician if vomiting continues.  Special Instructions/Symptoms: Your throat may feel dry or sore from the anesthesia or the breathing tube placed in your throat during surgery. If this causes  discomfort, gargle with warm salt water. The discomfort should disappear within 24 hours.  If you had a scopolamine patch placed behind your ear for the management of post- operative nausea and/or vomiting:  1. The medication in the patch is effective for 72 hours, after which it should be removed.  Wrap patch in a tissue and discard in the trash. Wash hands thoroughly with soap and water. 2. You may remove the patch earlier than 72 hours if you experience unpleasant side effects which may include dry mouth, dizziness or visual disturbances. 3. Avoid touching the patch. Wash your hands with soap and water after contact with the patch.

## 2015-03-10 NOTE — Anesthesia Postprocedure Evaluation (Signed)
Anesthesia Post Note  Patient: Ann Cox  Procedure(s) Performed: Procedure(s) (LRB): LEFT KNEE ARTHROSCOPY MEDIAL MENISCECTOMY  ANTERIOR CRUCIATE LIGAMENT (ACL) TIBIAL ANTERIOR ALLOGRAFT (Left)  Anesthesia type: General  Patient location: PACU  Post pain: Pain level controlled and Adequate analgesia  Post assessment: Post-op Vital signs reviewed, Patient's Cardiovascular Status Stable, Respiratory Function Stable, Patent Airway and Pain level controlled  Last Vitals:  Filed Vitals:   03/10/15 1245  BP: 112/66  Pulse: 69  Temp:   Resp: 12    Post vital signs: Reviewed and stable  Level of consciousness: awake, alert  and oriented  Complications: No apparent anesthesia complications

## 2015-03-14 ENCOUNTER — Encounter (HOSPITAL_BASED_OUTPATIENT_CLINIC_OR_DEPARTMENT_OTHER): Payer: Self-pay | Admitting: Orthopedic Surgery

## 2015-10-03 ENCOUNTER — Encounter: Payer: Self-pay | Admitting: Emergency Medicine

## 2015-10-03 ENCOUNTER — Emergency Department: Payer: BLUE CROSS/BLUE SHIELD

## 2015-10-03 ENCOUNTER — Emergency Department
Admission: EM | Admit: 2015-10-03 | Discharge: 2015-10-03 | Disposition: A | Discharge status: Home / Self Care | Payer: BLUE CROSS/BLUE SHIELD | Attending: Emergency Medicine | Admitting: Emergency Medicine

## 2015-10-03 DIAGNOSIS — Z79899 Other long term (current) drug therapy: Secondary | ICD-10-CM | POA: Diagnosis not present

## 2015-10-03 DIAGNOSIS — Z720 Tobacco use: Secondary | ICD-10-CM | POA: Insufficient documentation

## 2015-10-03 DIAGNOSIS — M25562 Pain in left knee: Secondary | ICD-10-CM | POA: Diagnosis not present

## 2015-10-03 MED ORDER — KETOROLAC TROMETHAMINE 60 MG/2ML IM SOLN
60.0000 mg | Freq: Once | INTRAMUSCULAR | Status: AC
  Administered 2015-10-03: 60 mg via INTRAMUSCULAR
  Filled 2015-10-03: qty 2

## 2015-10-03 MED ORDER — OXYCODONE-ACETAMINOPHEN 5-325 MG PO TABS: 1.0000 | ORAL_TABLET | ORAL | Status: DC | PRN

## 2015-10-03 MED ORDER — OXYCODONE-ACETAMINOPHEN 5-325 MG PO TABS
2.0000 | ORAL_TABLET | Freq: Once | ORAL | Status: AC
  Administered 2015-10-03: 2 via ORAL
  Filled 2015-10-03: qty 2

## 2015-10-03 NOTE — ED Notes (Signed)
States she had surgery to left knee in April . Now having increased pain unsure of new injury.  Min swelling noted

## 2015-10-03 NOTE — Discharge Instructions (Signed)
Cryotherapy °Cryotherapy means treatment with cold. Ice or gel packs can be used to reduce both pain and swelling. Ice is the most helpful within the first 24 to 48 hours after an injury or flare-up from overusing a muscle or joint. Sprains, strains, spasms, burning pain, shooting pain, and aches can all be eased with ice. Ice can also be used when recovering from surgery. Ice is effective, has very few side effects, and is safe for most people to use. °PRECAUTIONS  °Ice is not a safe treatment option for people with: °· Raynaud phenomenon. This is a condition affecting small blood vessels in the extremities. Exposure to cold may cause your problems to return. °· Cold hypersensitivity. There are many forms of cold hypersensitivity, including: °· Cold urticaria. Red, itchy hives appear on the skin when the tissues begin to warm after being iced. °· Cold erythema. This is a red, itchy rash caused by exposure to cold. °· Cold hemoglobinuria. Red blood cells break down when the tissues begin to warm after being iced. The hemoglobin that carry oxygen are passed into the urine because they cannot combine with blood proteins fast enough. °· Numbness or altered sensitivity in the area being iced. °If you have any of the following conditions, do not use ice until you have discussed cryotherapy with your caregiver: °· Heart conditions, such as arrhythmia, angina, or chronic heart disease. °· High blood pressure. °· Healing wounds or open skin in the area being iced. °· Current infections. °· Rheumatoid arthritis. °· Poor circulation. °· Diabetes. °Ice slows the blood flow in the region it is applied. This is beneficial when trying to stop inflamed tissues from spreading irritating chemicals to surrounding tissues. However, if you expose your skin to cold temperatures for too long or without the proper protection, you can damage your skin or nerves. Watch for signs of skin damage due to cold. °HOME CARE INSTRUCTIONS °Follow  these tips to use ice and cold packs safely. °· Place a dry or damp towel between the ice and skin. A damp towel will cool the skin more quickly, so you may need to shorten the time that the ice is used. °· For a more rapid response, add gentle compression to the ice. °· Ice for no more than 10 to 20 minutes at a time. The bonier the area you are icing, the less time it will take to get the benefits of ice. °· Check your skin after 5 minutes to make sure there are no signs of a poor response to cold or skin damage. °· Rest 20 minutes or more between uses. °· Once your skin is numb, you can end your treatment. You can test numbness by very lightly touching your skin. The touch should be so light that you do not see the skin dimple from the pressure of your fingertip. When using ice, most people will feel these normal sensations in this order: cold, burning, aching, and numbness. °· Do not use ice on someone who cannot communicate their responses to pain, such as small children or people with dementia. °HOW TO MAKE AN ICE PACK °Ice packs are the most common way to use ice therapy. Other methods include ice massage, ice baths, and cryosprays. Muscle creams that cause a cold, tingly feeling do not offer the same benefits that ice offers and should not be used as a substitute unless recommended by your caregiver. °To make an ice pack, do one of the following: °· Place crushed ice or a   bag of frozen vegetables in a sealable plastic bag. Squeeze out the excess air. Place this bag inside another plastic bag. Slide the bag into a pillowcase or place a damp towel between your skin and the bag.  Mix 3 parts water with 1 part rubbing alcohol. Freeze the mixture in a sealable plastic bag. When you remove the mixture from the freezer, it will be slushy. Squeeze out the excess air. Place this bag inside another plastic bag. Slide the bag into a pillowcase or place a damp towel between your skin and the bag. SEEK MEDICAL CARE  IF:  You develop white spots on your skin. This may give the skin a blotchy (mottled) appearance.  Your skin turns blue or pale.  Your skin becomes waxy or hard.  Your swelling gets worse. MAKE SURE YOU:   Understand these instructions.  Will watch your condition.  Will get help right away if you are not doing well or get worse.   This information is not intended to replace advice given to you by your health care provider. Make sure you discuss any questions you have with your health care provider.   Document Released: 07/22/2011 Document Revised: 12/16/2014 Document Reviewed: 07/22/2011 Elsevier Interactive Patient Education 2016 [place] therapy can help ease sore, stiff, injured, and tight muscles and joints. Heat relaxes your muscles, which may help ease your pain.  RISKS AND COMPLICATIONS If you have any of the following conditions, do not use heat therapy unless your health care provider has approved:  Poor circulation.  Healing wounds or scarred skin in the area being treated.  Diabetes, heart disease, or high blood pressure.  Not being able to feel (numbness) the area being treated.  Unusual swelling of the area being treated.  Active infections.  Blood clots.  Cancer.  Inability to communicate pain. This may include young children and people who have problems with their brain function (dementia).  Pregnancy. Heat therapy should only be used on old, pre-existing, or long-lasting (chronic) injuries. Do not use heat therapy on new injuries unless directed by your health care provider. HOW TO USE HEAT THERAPY There are several different kinds of heat therapy, including:  Moist heat pack.  Warm water bath.  Hot water bottle.  Electric heating pad.  Heated gel pack.  Heated wrap.  Electric heating pad. Use the heat therapy method suggested by your health care provider. Follow your health care provider's instructions on when  and how to use heat therapy. GENERAL HEAT THERAPY RECOMMENDATIONS  Do not sleep while using heat therapy. Only use heat therapy while you are awake.  Your skin may turn pink while using heat therapy. Do not use heat therapy if your skin turns red.  Do not use heat therapy if you have new pain.  High heat or long exposure to heat can cause burns. Be careful when using heat therapy to avoid burning your skin.  Do not use heat therapy on areas of your skin that are already irritated, such as with a rash or sunburn. SEEK MEDICAL CARE IF:  You have blisters, redness, swelling, or numbness.  You have new pain.  Your pain is worse. MAKE SURE YOU:  Understand these instructions.  Will watch your condition.  Will get help right away if you are not doing well or get worse.   This information is not intended to replace advice given to you by your health care provider. Make sure you discuss any questions you  have with your health care provider.   Document Released: 02/17/2012 Document Revised: 12/16/2014 Document Reviewed: 01/18/2014 Elsevier Interactive Patient Education 2016 [employer]  Knee Pain Knee pain is a very common symptom and can have many causes. Knee pain often goes away when you follow your health care provider's instructions for relieving pain and discomfort at home. However, knee pain can develop into a condition that needs treatment. Some conditions may include:  Arthritis caused by wear and tear (osteoarthritis).  Arthritis caused by swelling and irritation (rheumatoid arthritis or gout).  A cyst or growth in your knee.  An infection in your knee joint.  An injury that will not heal.  Damage, swelling, or irritation of the tissues that support your knee (torn ligaments or tendinitis). If your knee pain continues, additional tests may be ordered to diagnose your condition. Tests may include X-rays or other imaging studies of your knee. You may also need to have  fluid removed from your knee. Treatment for ongoing knee pain depends on the cause, but treatment may include:  Medicines to relieve pain or swelling.  Steroid injections in your knee.  Physical therapy.  Surgery. HOME CARE INSTRUCTIONS  Take medicines only as directed by your health care provider.  Rest your knee and keep it raised (elevated) while you are resting.  Do not do things that cause or worsen pain.  Avoid high-impact activities or exercises, such as running, jumping rope, or doing jumping jacks.  Apply ice to the knee area:  Put ice in a plastic bag.  Place a towel between your skin and the bag.  Leave the ice on for 20 minutes, 2-3 times a day.  Ask your health care provider if you should wear an elastic knee support.  Keep a pillow under your knee when you sleep.  Lose weight if you are overweight. Extra weight can put pressure on your knee.  Do not use any tobacco products, including cigarettes, chewing tobacco, or electronic cigarettes. If you need help quitting, ask your health care provider. Smoking may slow the healing of any bone and joint problems that you may have. SEEK MEDICAL CARE IF:  Your knee pain continues, changes, or gets worse.  You have a fever along with knee pain.  Your knee buckles or locks up.  Your knee becomes more swollen. SEEK IMMEDIATE MEDICAL CARE IF:   Your knee joint feels hot to the touch.  You have chest pain or trouble breathing.   This information is not intended to replace advice given to you by your health care provider. Make sure you discuss any questions you have with your health care provider.   Document Released: 09/22/2007 Document Revised: 12/16/2014 Document Reviewed: 07/11/2014 Elsevier Interactive Patient Education [employer].

## 2015-10-03 NOTE — ED Notes (Signed)
States she is having increased pain to left knee. Does a lot of walking at work but denies  Recent injury

## 2015-10-03 NOTE — ED Notes (Signed)
Discussed discharge instructions, prescriptions, and follow-up care with patient. No questions or concerns at this time. Pt stable at discharge.  

## 2015-10-03 NOTE — ED Provider Notes (Signed)
[facility] Emergency Department Provider Note  ____________________________________________  Time seen: Approximately 5:56 PM  I have reviewed the triage vital signs and the nursing notes.   HISTORY  Chief Complaint Knee Pain    HPI Ann Cox is a 39 y.o. female resents for evaluation of left knee pain. Patient states past medical history significant for anterior cruciate ligament repair and meniscus repair. States that for some reason the pain just started back today. Denies any known trauma.   Past Medical History  Diagnosis Date  . ACL sprain 02/2015    left  . Medial meniscus tear 02/2015    left knee  . History of recurrent UTI (urinary tract infection)   . ACL (anterior cruciate ligament) rupture 03/10/2014    Patient Active Problem List   Diagnosis Date Noted  . ACL (anterior cruciate ligament) rupture 03/10/2014  . Smoker 02/17/2014  . Left knee pain 02/16/2014  . History of ovarian cyst 02/16/2014    Past Surgical History  Procedure Laterality Date  . No past surgeries    . Arthroscopy with anterior cruciate ligament (acl) repair with anterior tibilias graft Left 03/10/2015    Procedure: LEFT KNEE ARTHROSCOPY MEDIAL MENISCECTOMY  ANTERIOR CRUCIATE LIGAMENT (ACL) TIBIAL ANTERIOR ALLOGRAFT;  Surgeon: Marchia Bond, MD;  Location: [place];  Service: Orthopedics;  Laterality: Left;  . Knee arthroscopy with medial menisectomy Left 03/10/2015    Procedure: KNEE ARTHROSCOPY WITH MEDIAL MENISECTOMY;  Surgeon: Marchia Bond, MD;  Location: [place];  Service: Orthopedics;  Laterality: Left;    Current Outpatient Rx  Name  Route  Sig  Dispense  Refill  . baclofen (LIORESAL) 10 MG tablet   Oral   Take 1 tablet (10 mg total) by mouth 3 (three) times daily. As needed for muscle spasm   50 tablet   0   . ondansetron (ZOFRAN) 4 MG tablet   Oral   Take 1 tablet (4 mg total) by mouth every 8 (eight) hours as  needed for nausea or vomiting.   30 tablet   0   . oxyCODONE-acetaminophen (ROXICET) 5-325 MG tablet   Oral   Take 1-2 tablets by mouth every 4 (four) hours as needed for severe pain.   24 tablet   0   . sennosides-docusate sodium (SENOKOT-S) 8.6-50 MG tablet   Oral   Take 2 tablets by mouth daily.   30 tablet   1     Allergies Review of patient's allergies indicates no known allergies.  Family History  Problem Relation Age of Onset  . Adopted: Yes  . Alcohol abuse Maternal Uncle   . Alcohol abuse Paternal Uncle   . Arthritis Maternal Grandmother   . Rheum arthritis Paternal Uncle   . Arthritis Mother   . Arthritis Father   . Uterine cancer Maternal Aunt   . Hyperlipidemia Father   . Hyperlipidemia Maternal Grandfather   . Heart disease Maternal Grandmother   . Hypertension Mother   . Hypertension Father   . Anxiety disorder Mother   . Anxiety disorder Maternal Aunt   . Anxiety disorder Maternal Grandmother     Social History Social History  Substance Use Topics  . Smoking status: Current Every Day Smoker -- 0.50 packs/day for 14 years    Types: Cigarettes  . Smokeless tobacco: Never Used  . Alcohol Use: Yes     Comment: occasionally    Review of Systems Constitutional: No fever/chills Eyes: No visual changes. ENT: No sore  throat. Cardiovascular: Denies chest pain. Respiratory: Denies shortness of breath. Gastrointestinal: No abdominal pain.  No nausea, no vomiting.  No diarrhea.  No constipation. Genitourinary: Negative for dysuria. Musculoskeletal: Positive for left knee pain. Skin: Negative for rash. Neurological: Negative for headaches, focal weakness or numbness.  10-point ROS otherwise negative.  ____________________________________________   PHYSICAL EXAM:  VITAL SIGNS: ED Triage Vitals  Enc Vitals Group     BP 10/03/15 1753 135/79 mmHg     Pulse Rate 10/03/15 1753 73     Resp 10/03/15 1753 18     Temp 10/03/15 1753 98.5 F (36.9 C)      Temp Source 10/03/15 1753 Oral     SpO2 10/03/15 1753 98 %     Weight 10/03/15 1753 160 lb (72.576 kg)     Height 10/03/15 1753 5\' 3"  (1.6 m)     Head Cir --      Peak Flow --      Pain Score --      Pain Loc --      Pain Edu? --      Excl. in [place]? --     Constitutional: Alert and oriented. Well appearing and in no acute distress.  Cardiovascular: Normal rate, regular rhythm. Grossly normal heart sounds.  Good peripheral circulation. Respiratory: Normal respiratory effort.  No retractions. Lungs CTAB. Gastrointestinal: Soft and nontender. No distention. No abdominal bruits. No CVA tenderness. Musculoskeletal: Left knee tenderness with no obvious effusion. Limited range of motion increased pain with extension and flexion. Neurologic:  Normal speech and language. No gross focal neurologic deficits are appreciated. No gait instability. Skin:  Skin is warm, dry and intact. No rash noted. Psychiatric: Mood and affect are normal. Speech and behavior are normal.  ____________________________________________   LABS (all labs ordered are listed, but only abnormal results are displayed)  Labs Reviewed - No data to display ____________________________________________    RADIOLOGY  Left knee negative for any acute effusion or knee abnormality. ____________________________________________   PROCEDURES  Procedure(s) performed: None  Critical Care performed: No  ____________________________________________   INITIAL IMPRESSION / ASSESSMENT AND PLAN / ED COURSE  Pertinent labs & imaging results that were available during my care of the patient were reviewed by me and considered in my medical decision making (see chart for details).  Left knee pain, acute. Past medical history of anterior cruciate ligament sprain with medial meniscus parent anterior cruciate ligament repair. Patient to follow up with orthopedic doctor for continuity of care. Rx given for Percocet 5/325. [place] controlled substance registry reviewed prior to dispensing. Patient voices no other emergency medical complaints at this time. Has crutches at home and will follow-up if needed. ____________________________________________   FINAL CLINICAL IMPRESSION(S) / ED DIAGNOSES  Final diagnoses:  Knee pain, left      Arlyss Repress, PA-C 10/03/15 1850  Orbie Pyo, MD 10/03/15 2214

## 2015-10-26 ENCOUNTER — Other Ambulatory Visit: Payer: Self-pay | Admitting: *Deleted

## 2015-10-26 ENCOUNTER — Inpatient Hospital Stay
Admission: RE | Admit: 2015-10-26 | Discharge: 2015-10-26 | Disposition: A | Discharge status: Home / Self Care | Payer: Self-pay | Source: Ambulatory Visit | Attending: *Deleted | Admitting: *Deleted

## 2015-10-26 ENCOUNTER — Other Ambulatory Visit: Payer: Self-pay | Admitting: Unknown Physician Specialty

## 2015-10-26 DIAGNOSIS — R928 Other abnormal and inconclusive findings on diagnostic imaging of breast: Secondary | ICD-10-CM

## 2015-10-26 DIAGNOSIS — Z9289 Personal history of other medical treatment: Secondary | ICD-10-CM

## 2015-11-09 DIAGNOSIS — Z1501 Genetic susceptibility to malignant neoplasm of breast: Secondary | ICD-10-CM

## 2015-11-09 DIAGNOSIS — Z1509 Genetic susceptibility to other malignant neoplasm: Secondary | ICD-10-CM

## 2015-11-09 DIAGNOSIS — Z1371 Encounter for nonprocreative screening for genetic disease carrier status: Secondary | ICD-10-CM

## 2015-11-09 HISTORY — DX: Genetic susceptibility to malignant neoplasm of breast: Z15.01

## 2015-11-09 HISTORY — DX: Genetic susceptibility to other malignant neoplasm: Z15.09

## 2015-11-09 HISTORY — DX: Encounter for nonprocreative screening for genetic disease carrier status: Z13.71

## 2015-11-14 ENCOUNTER — Ambulatory Visit
Admission: RE | Admit: 2015-11-14 | Discharge: 2015-11-14 | Disposition: A | Discharge status: Home / Self Care | Payer: BLUE CROSS/BLUE SHIELD | Source: Ambulatory Visit | Attending: Unknown Physician Specialty | Admitting: Unknown Physician Specialty

## 2015-11-14 DIAGNOSIS — N6489 Other specified disorders of breast: Secondary | ICD-10-CM | POA: Insufficient documentation

## 2015-11-14 DIAGNOSIS — R928 Other abnormal and inconclusive findings on diagnostic imaging of breast: Secondary | ICD-10-CM | POA: Diagnosis present

## 2016-01-18 ENCOUNTER — Inpatient Hospital Stay: Payer: BLUE CROSS/BLUE SHIELD | Admitting: Oncology

## 2016-12-16 ENCOUNTER — Other Ambulatory Visit: Payer: Self-pay | Admitting: Obstetrics & Gynecology

## 2016-12-16 DIAGNOSIS — N63 Unspecified lump in unspecified breast: Secondary | ICD-10-CM

## 2016-12-17 ENCOUNTER — Ambulatory Visit
Admission: RE | Admit: 2016-12-17 | Discharge: 2016-12-17 | Disposition: A | Discharge status: Home / Self Care | Payer: BLUE CROSS/BLUE SHIELD | Source: Ambulatory Visit | Attending: Obstetrics & Gynecology | Admitting: Obstetrics & Gynecology

## 2016-12-17 ENCOUNTER — Encounter: Payer: Self-pay | Admitting: General Surgery

## 2016-12-17 DIAGNOSIS — N63 Unspecified lump in unspecified breast: Secondary | ICD-10-CM

## 2016-12-19 ENCOUNTER — Ambulatory Visit (INDEPENDENT_AMBULATORY_CARE_PROVIDER_SITE_OTHER): Payer: BLUE CROSS/BLUE SHIELD | Admitting: General Surgery

## 2016-12-19 ENCOUNTER — Encounter: Payer: Self-pay | Admitting: General Surgery

## 2016-12-19 VITALS — BP 132/68 | HR 78 | Resp 12 | Ht 63.0 in | Wt 152.0 lb

## 2016-12-19 DIAGNOSIS — Z1501 Genetic susceptibility to malignant neoplasm of breast: Secondary | ICD-10-CM

## 2016-12-19 DIAGNOSIS — Z1589 Genetic susceptibility to other disease: Secondary | ICD-10-CM | POA: Diagnosis not present

## 2016-12-19 DIAGNOSIS — Z1509 Genetic susceptibility to other malignant neoplasm: Secondary | ICD-10-CM

## 2016-12-19 DIAGNOSIS — N611 Abscess of the breast and nipple: Secondary | ICD-10-CM | POA: Diagnosis not present

## 2016-12-19 MED ORDER — DOXYCYCLINE HYCLATE 100 MG PO TABS: 100.0000 mg | ORAL_TABLET | Freq: Two times a day (BID) | ORAL | 0 refills | Status: DC

## 2016-12-19 NOTE — Patient Instructions (Signed)
Return in one month.  

## 2016-12-19 NOTE — Progress Notes (Signed)
Patient ID: Ann Cox, female   DOB: 1976-10-18, 41 y.o.   MRN: [id number]  Chief Complaint  Patient presents with  . Other    HPI Ann Cox is a 41 y.o. female who presents for a breast evaluation. The most recent mammogram was done on 12/18/2015. BRCA  positive. She felt a lump in her left breast in  12/2015 and she felt it two weeks are her mammogram which was on 11/2015.The area has got bigger over the last couple weeks. A shooting pain through her nipple.  Patient does perform regular self breast checks and gets regular mammograms done.    HPI  Past Medical History:  Diagnosis Date  . ACL (anterior cruciate ligament) rupture 03/10/2014  . ACL sprain 02/2015   left  . History of recurrent UTI (urinary tract infection)   . Medial meniscus tear 02/2015   left knee    Past Surgical History:  Procedure Laterality Date  . ARTHROSCOPY WITH ANTERIOR CRUCIATE LIGAMENT (ACL) REPAIR WITH ANTERIOR TIBILIAS GRAFT Left 03/10/2015   Procedure: LEFT KNEE ARTHROSCOPY MEDIAL MENISCECTOMY  ANTERIOR CRUCIATE LIGAMENT (ACL) TIBIAL ANTERIOR ALLOGRAFT;  Surgeon: Marchia Bond, MD;  Location: [place];  Service: Orthopedics;  Laterality: Left;  . KNEE ARTHROSCOPY WITH MEDIAL MENISECTOMY Left 03/10/2015   Procedure: KNEE ARTHROSCOPY WITH MEDIAL MENISECTOMY;  Surgeon: Marchia Bond, MD;  Location: [place];  Service: Orthopedics;  Laterality: Left;  . NO PAST SURGERIES      Family History  Problem Relation Age of Onset  . Adopted: Yes  . Alcohol abuse Maternal Uncle   . Alcohol abuse Paternal Uncle   . Arthritis Maternal Grandmother   . Heart disease Maternal Grandmother   . Anxiety disorder Maternal Grandmother   . Rheum arthritis Paternal Uncle   . Arthritis Mother   . Hypertension Mother   . Anxiety disorder Mother   . Arthritis Father   . Hyperlipidemia Father   . Hypertension Father   . Uterine cancer Maternal Aunt   . Hyperlipidemia Maternal  Grandfather   . Breast cancer Maternal Grandfather   . Anxiety disorder Maternal Aunt   . Ovarian cancer Maternal Aunt     Social History Social History  Substance Use Topics  . Smoking status: Current Every Day Smoker    Packs/day: 0.50    Years: 14.00    Types: Cigarettes  . Smokeless tobacco: Never Used  . Alcohol use Yes     Comment: occasionally    No Known Allergies  Current Outpatient Prescriptions  Medication Sig Dispense Refill  . doxycycline (VIBRA-TABS) 100 MG tablet Take 1 tablet (100 mg total) by mouth 2 (two) times daily. 42 tablet 0   No current facility-administered medications for this visit.     Review of Systems Review of Systems  Constitutional: Negative.   Respiratory: Negative.   Cardiovascular: Negative.     Blood pressure 132/68, pulse 78, resp. rate 12, height _0  (1.6 m), weight 152 lb (68.9 kg), last menstrual period 12/08/2016.  Physical Exam Physical Exam  Constitutional: She is oriented to person, place, and time. She appears well-developed and well-nourished.  Eyes: Conjunctivae are normal. No scleral icterus.  Neck: Neck supple.  Cardiovascular: Normal rate and regular rhythm.   Murmur heard.  Systolic murmur is present with a grade of 1/6  Pulmonary/Chest: Effort normal and breath sounds normal. Right breast exhibits no inverted nipple, no mass, no nipple discharge, no skin change and no tenderness. Left breast exhibits no inverted  nipple, no mass, no nipple discharge, no skin change and no tenderness.    Abdominal: Soft. Bowel sounds are normal. There is no tenderness.  Lymphadenopathy:    She has no cervical adenopathy.  Neurological: She is alert and oriented to person, place, and time.  Skin: Skin is warm and dry.    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 This reported a potential risk for breast cancer up  to 9% by age 31, 17-52% by age 61.  "Elevated risk" for pancreatic cancer.  Recommended imaging studies included annual breast MRI in addition to mammography.     Review of the up to date database, refreshed September 2017:  ATM-Ataxia-telangiectasia (AT) is an autosomal recessive disorder (biallelic) associated with a defective ATM gene (for AT mutated). The ATM mutations that cause AT in biallelic carriers are breast cancer susceptibility alleles in monoallelic carriers [60].  For female ATM heterozygotes, the risk of breast cancer is increased approximately twofold (moderate risk) higher than noncarriers in the general population [68-70]. The risk of ovarian cancer in heterozygotes does not appear to be elevated. While patients with ATM are particularly sensitive to ionizing radiation and chemotherapeutic agents that cause double-stranded breaks in DNA, ATM heterozygotes are less sensitive [71].  It is estimated that approximately 3 percent of Caucasians in the [place] are ATM heterozygotes [72]. In a retrospective study of 11 BRCA1 and BRCA2-negative familial breast cancer patients and 521 control breast cancer patients, ATM mutations were more commonly identified in patients with familial breast cancer compared with the control population (12 versus 2 deleterious ATM mutations, p = [id number]) [68]. Relatives of individuals with AT, especially obligate carrier mothers of affected children, should be informed about the elevated cancer risks and potential screening strategies. ATM mutations have also been associated with increased risks for pancreatic cancer, but more data are needed to confirm this finding [73].   The risk of ovarian cancer in heterozygotes does not appear to be elevated; however, the potential increased risk for this and other cancers has not been well characterized.   GYN notes of 12/29/2015 and 12/16/2016 based bar were reviewed.  Bilateral mammograms and ultrasound  dated 12/17/2016 reviewed. "Mammographically stable. Density category C. Complex fluid collection of the dermis of the 12:00 position, BIRAD-2. These studies were reviewed.  Assessment    Left breast abscess related to ductal structures.  Modest elevation risk for breast cancer based on literature review.  Questionable increased risk of pancreatic cancer.  Smoker.    Plan    The patient replaced in the trial doxycycline 100 mg by mouth twice a day as well as the use of local warm compresses to see if this area will resolve. It will likely require drainage, and smoking cessation would be in the patient could do to improve her likelihood of resolution without surgical intervention.  Consideration can be given to breast MRI, but at this time her greatest risk for premature death is ongoing smoking.  We'll review options for management at her upcoming visit trying to balance practicality and cost with expected benefit.    Patient to return in one month.  This information has been scribed by Gaspar Cola CMA.   Robert Bellow 12/20/2016, 7:53 PM

## 2016-12-20 DIAGNOSIS — Z1509 Genetic susceptibility to other malignant neoplasm: Secondary | ICD-10-CM

## 2016-12-20 DIAGNOSIS — N611 Abscess of the breast and nipple: Secondary | ICD-10-CM | POA: Insufficient documentation

## 2016-12-20 DIAGNOSIS — Z1501 Genetic susceptibility to malignant neoplasm of breast: Secondary | ICD-10-CM | POA: Insufficient documentation

## 2016-12-20 DIAGNOSIS — Z1589 Genetic susceptibility to other disease: Secondary | ICD-10-CM

## 2017-01-20 ENCOUNTER — Ambulatory Visit: Payer: BLUE CROSS/BLUE SHIELD | Admitting: General Surgery

## 2017-01-28 ENCOUNTER — Other Ambulatory Visit: Payer: Self-pay | Admitting: General Surgery

## 2017-03-20 ENCOUNTER — Encounter: Payer: Self-pay | Admitting: *Deleted

## 2019-07-09 ENCOUNTER — Other Ambulatory Visit: Payer: Self-pay | Admitting: Family Medicine

## 2019-07-09 DIAGNOSIS — Z1231 Encounter for screening mammogram for malignant neoplasm of breast: Secondary | ICD-10-CM

## 2019-07-29 ENCOUNTER — Inpatient Hospital Stay: Payer: BC Managed Care – PPO | Attending: Oncology

## 2019-07-29 ENCOUNTER — Other Ambulatory Visit: Payer: Self-pay | Admitting: *Deleted

## 2019-07-29 ENCOUNTER — Encounter: Payer: Self-pay | Admitting: Oncology

## 2019-07-29 ENCOUNTER — Inpatient Hospital Stay: Payer: BC Managed Care – PPO | Attending: Oncology | Admitting: Oncology

## 2019-07-29 ENCOUNTER — Other Ambulatory Visit: Payer: Self-pay

## 2019-07-29 VITALS — BP 135/78 | HR 70 | Temp 97.1°F | Ht 63.0 in | Wt 142.1 lb

## 2019-07-29 DIAGNOSIS — D509 Iron deficiency anemia, unspecified: Secondary | ICD-10-CM | POA: Insufficient documentation

## 2019-07-29 DIAGNOSIS — E538 Deficiency of other specified B group vitamins: Secondary | ICD-10-CM

## 2019-07-29 DIAGNOSIS — N920 Excessive and frequent menstruation with regular cycle: Secondary | ICD-10-CM | POA: Diagnosis not present

## 2019-07-29 DIAGNOSIS — F17210 Nicotine dependence, cigarettes, uncomplicated: Secondary | ICD-10-CM | POA: Insufficient documentation

## 2019-07-29 LAB — IRON AND TIBC
Iron: 15 ug/dL — ABNORMAL LOW (ref 28–170)
Saturation Ratios: 2 % — ABNORMAL LOW (ref 10.4–31.8)
TIBC: 618 ug/dL — ABNORMAL HIGH (ref 250–450)
UIBC: 603 ug/dL

## 2019-07-29 LAB — CBC
HCT: 25.4 % — ABNORMAL LOW (ref 36.0–46.0)
Hemoglobin: 6.9 g/dL — ABNORMAL LOW (ref 12.0–15.0)
MCH: 18.4 pg — ABNORMAL LOW (ref 26.0–34.0)
MCHC: 27.2 g/dL — ABNORMAL LOW (ref 30.0–36.0)
MCV: 67.7 fL — ABNORMAL LOW (ref 80.0–100.0)
Platelets: 200 10*3/uL (ref 150–400)
RBC: 3.75 MIL/uL — ABNORMAL LOW (ref 3.87–5.11)
RDW: 19.4 % — ABNORMAL HIGH (ref 11.5–15.5)
WBC: 6 10*3/uL (ref 4.0–10.5)
nRBC: 0 % (ref 0.0–0.2)

## 2019-07-29 LAB — FERRITIN: Ferritin: 2 ng/mL — ABNORMAL LOW (ref 11–307)

## 2019-07-30 ENCOUNTER — Encounter: Payer: Self-pay | Admitting: Oncology

## 2019-07-30 ENCOUNTER — Telehealth: Payer: Self-pay | Admitting: Obstetrics & Gynecology

## 2019-07-30 NOTE — Progress Notes (Signed)
Hematology/Oncology Consult note [facility] [phone] [phone]  Patient Care Team: Patient, No Pcp Per as PCP - General (General Practice) Gae Dry, MD as Referring Physician (Obstetrics and Gynecology) Bary Castilla Forest Gleason, MD (General Surgery)   Name of the patient: Ann Cox  [id number]  12-20-1975    Reason for referral-iron deficiency anemia   Referring physician-Dr. Jimmye Norman  Date of visit: 07/30/19   History of presenting illness-patient is a 43 year old female  referred to [place] for iron deficiency anemia.  Her recent CBC from 07/16/2019 showed a white count of 7.3, H&H of 6.9/27 with an MCV of 72 and a platelet count of 253.  Iron studies were not done.  Folate TSH was normal.  B12 levels were low at 166.  She has received 1 dose of B12 shot so far.  Patient does not have any significant fatigue and continues to be active and works at [employer].  She reports craving for ice.  I do not have her prior iron studies for comparison.  She does report heavy menstrual cycles which sometimes last for a week and the first few days are especially extremely heavy.  But she has to change her sanitary pads or tampons every hour or so.  Her appetite is good and her weight is stable.  Denies any blood in her stool or urine.  Denies any consistent use of NSAIDs, Goody powder BC powder.  ECOG PS- 0  Pain scale- 0   Review of systems- Review of Systems  Constitutional: Negative for chills, fever, malaise/fatigue and weight loss.  HENT: Negative for congestion, ear discharge and nosebleeds.   Eyes: Negative for blurred vision.  Respiratory: Negative for cough, hemoptysis, sputum production, shortness of breath and wheezing.   Cardiovascular: Negative for chest pain, palpitations, orthopnea and claudication.  Gastrointestinal: Negative for abdominal pain, blood in stool, constipation, diarrhea, heartburn, melena, nausea and vomiting.   Genitourinary: Negative for dysuria, flank pain, frequency, hematuria and urgency.  Musculoskeletal: Negative for back pain, joint pain and myalgias.  Skin: Negative for rash.  Neurological: Negative for dizziness, tingling, focal weakness, seizures, weakness and headaches.  Endo/Heme/Allergies: Does not bruise/bleed easily.  Psychiatric/Behavioral: Negative for depression and suicidal ideas. The patient does not have insomnia.     No Known Allergies  Patient Active Problem List   Diagnosis Date Noted  . Iron deficiency anemia 07/29/2019  . 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 Abscess of breast 12/20/2016  . ACL (anterior cruciate ligament) rupture 03/10/2014  . Smoker 02/17/2014  . Left knee pain 02/16/2014  . History of ovarian cyst 02/16/2014     Past Medical History:  Diagnosis Date  . ACL (anterior cruciate ligament) rupture 03/10/2014  . ACL sprain 02/2015   left  . History of recurrent UTI (urinary tract infection)   . Medial meniscus tear 02/2015   left knee     Past Surgical History:  Procedure Laterality Date  . ARTHROSCOPY WITH ANTERIOR CRUCIATE LIGAMENT (ACL) REPAIR WITH ANTERIOR TIBILIAS GRAFT Left 03/10/2015   Procedure: LEFT KNEE ARTHROSCOPY MEDIAL MENISCECTOMY  ANTERIOR CRUCIATE LIGAMENT (ACL) TIBIAL ANTERIOR ALLOGRAFT;  Surgeon: Marchia Bond, MD;  Location: [place];  Service: Orthopedics;  Laterality: Left;  . KNEE ARTHROSCOPY WITH MEDIAL MENISECTOMY Left 03/10/2015   Procedure: KNEE ARTHROSCOPY WITH MEDIAL MENISECTOMY;  Surgeon: Marchia Bond, MD;  Location: [place];  Service: Orthopedics;  Laterality: Left;  . NO PAST SURGERIES      Social  History   Socioeconomic History  . Marital status: Single    Spouse name: Not on file  . Number of children: Not on file  . Years of education: Not on file  . Highest education level: Not on file  Occupational History  . Not on file  Social Needs  . Financial resource  strain: Not on file  . Food insecurity    Worry: Not on file    Inability: Not on file  . Transportation needs    Medical: Not on file    Non-medical: Not on file  Tobacco Use  . Smoking status: Current Every Day Smoker    Packs/day: 0.50    Years: 14.00    Pack years: 7.00    Types: Cigarettes  . Smokeless tobacco: Never Used  Substance and Sexual Activity  . Alcohol use: Yes    Comment: occasionally  . Drug use: No  . Sexual activity: Not on file  Lifestyle  . Physical activity    Days per week: Not on file    Minutes per session: Not on file  . Stress: Not on file  Relationships  . Social Herbalist on phone: Not on file    Gets together: Not on file    Attends religious service: Not on file    Active member of club or organization: Not on file    Attends meetings of clubs or organizations: Not on file    Relationship status: Not on file  . Intimate partner violence    Fear of current or ex partner: Not on file    Emotionally abused: Not on file    Physically abused: Not on file    Forced sexual activity: Not on file  Other Topics Concern  . Not on file  Social History Narrative  . Not on file     Family History  Adopted: Yes  Problem Relation Age of Onset  . Alcohol abuse Maternal Uncle   . Alcohol abuse Paternal Uncle   . Arthritis Maternal Grandmother   . Heart disease Maternal Grandmother   . Anxiety disorder Maternal Grandmother   . Rheum arthritis Paternal Uncle   . Arthritis Mother   . Hypertension Mother   . Anxiety disorder Mother   . Arthritis Father   . Hyperlipidemia Father   . Hypertension Father   . Uterine cancer Maternal Aunt   . Hyperlipidemia Maternal Grandfather   . Breast cancer Maternal Grandfather   . Anxiety disorder Maternal Aunt   . Ovarian cancer Maternal Aunt      Current Outpatient Medications:  Marland Kitchen  ZUBSOLV 2.9-0.71 MG SUBL, , Disp: , Rfl:    Physical exam:  Vitals:   07/29/19 1503  BP: 135/78  Pulse: 70   Temp: (!) 97.1 F (36.2 C)  Weight: 142 lb 1.6 oz (64.5 kg)  Height: 5' 3" (1.6 m)   Physical Exam HENT:     Head: Normocephalic and atraumatic.  Eyes:     Pupils: Pupils are equal, round, and reactive to light.  Neck:     Musculoskeletal: Normal range of motion.  Cardiovascular:     Rate and Rhythm: Normal rate and regular rhythm.     Heart sounds: Normal heart sounds.  Pulmonary:     Effort: Pulmonary effort is normal.     Breath sounds: Normal breath sounds.  Abdominal:     General: Bowel sounds are normal.     Palpations: Abdomen is soft.  Skin:  General: Skin is warm and dry.  Neurological:     Mental Status: She is alert and oriented to person, place, and time.        No flowsheet data found. CBC Latest Ref Rng & Units 07/29/2019  WBC 4.0 - 10.5 K/uL 6.0  Hemoglobin 12.0 - 15.0 g/dL 6.9(L)  Hematocrit 36.0 - 46.0 % 25.4(L)  Platelets 150 - 400 K/uL 200     Assessment and plan- Patient is a 43 y.o. female referred for iron deficiency anemia possibly secondary to menorrhagia  I will check a CBC with differential along with ferritin and iron studies today.  Despite patient being significantly anemic she is relatively asymptomatic.  It is unclear as to how long she has had such as severe microcytic anemia.  Given that she is young with relatively few comorbidities and not significant symptoms I will hold off on a blood transfusion at this time.  I recommend 2 doses of Feraheme 510 mg IV weekly starting next week.  Discussed risks and benefits of Feraheme including all but not limited to nausea, headache, leg swelling and possible risk of infusion reaction.  Patient understands and agrees to proceed as planned.  I will see her back in 2 months time with repeat CBC ferritin and iron studies.  I will also refer her to GYN given her menorrhagia which is likely the etiology of her iron deficiency and needs to be addressed along with IV iron   Thank you for this kind  referral and the opportunity to participate in the care of this patient   Visit Diagnosis 1. Iron deficiency anemia, unspecified iron deficiency anemia type   2. B12 deficiency     Dr. Randa Evens, MD, MPH [facility] [phone] 07/30/2019 1:19 PM

## 2019-07-30 NOTE — Telephone Encounter (Signed)
[place] referring for Menorrhagia. Called and left voicemail for patient to call back to be schedule

## 2019-08-02 NOTE — Telephone Encounter (Signed)
Called and left voice mail for patient to call back to be schedule °

## 2019-08-05 ENCOUNTER — Other Ambulatory Visit: Payer: Self-pay

## 2019-08-05 ENCOUNTER — Inpatient Hospital Stay: Payer: BC Managed Care – PPO

## 2019-08-05 VITALS — BP 128/76 | HR 74 | Temp 97.3°F | Resp 18

## 2019-08-05 DIAGNOSIS — D509 Iron deficiency anemia, unspecified: Secondary | ICD-10-CM

## 2019-08-05 MED ORDER — SODIUM CHLORIDE 0.9 % IV SOLN
Freq: Once | INTRAVENOUS | Status: AC
  Administered 2019-08-05: 14:00:00 via INTRAVENOUS
  Filled 2019-08-05: qty 250

## 2019-08-05 MED ORDER — CYANOCOBALAMIN 1000 MCG/ML IJ SOLN
1000.0000 ug | INTRAMUSCULAR | Status: DC
  Administered 2019-08-05: 1000 ug via INTRAMUSCULAR
  Filled 2019-08-05: qty 1

## 2019-08-05 MED ORDER — SODIUM CHLORIDE 0.9 % IV SOLN
510.0000 mg | Freq: Once | INTRAVENOUS | Status: AC
  Administered 2019-08-05: 510 mg via INTRAVENOUS
  Filled 2019-08-05: qty 17

## 2019-08-05 NOTE — Telephone Encounter (Signed)
Called and left voice mail for patient to call back to be schedule °

## 2019-08-12 ENCOUNTER — Inpatient Hospital Stay: Payer: BC Managed Care – PPO | Attending: Oncology

## 2019-08-12 ENCOUNTER — Other Ambulatory Visit: Payer: Self-pay

## 2019-08-12 VITALS — BP 115/71 | HR 71 | Temp 97.0°F | Resp 18

## 2019-08-12 DIAGNOSIS — D509 Iron deficiency anemia, unspecified: Secondary | ICD-10-CM | POA: Diagnosis present

## 2019-08-12 DIAGNOSIS — E538 Deficiency of other specified B group vitamins: Secondary | ICD-10-CM | POA: Insufficient documentation

## 2019-08-12 MED ORDER — SODIUM CHLORIDE 0.9 % IV SOLN
Freq: Once | INTRAVENOUS | Status: AC
  Administered 2019-08-12: 15:00:00 via INTRAVENOUS
  Filled 2019-08-12: qty 250

## 2019-08-12 MED ORDER — CYANOCOBALAMIN 1000 MCG/ML IJ SOLN
1000.0000 ug | INTRAMUSCULAR | Status: DC
  Administered 2019-08-12: 1000 ug via INTRAMUSCULAR
  Filled 2019-08-12: qty 1

## 2019-08-12 MED ORDER — SODIUM CHLORIDE 0.9 % IV SOLN
510.0000 mg | Freq: Once | INTRAVENOUS | Status: AC
  Administered 2019-08-12: 15:00:00 510 mg via INTRAVENOUS
  Filled 2019-08-12: qty 17

## 2019-09-27 NOTE — Progress Notes (Unsigned)
Patient is coming in for follow up she is doing well no complaints  

## 2019-09-28 ENCOUNTER — Inpatient Hospital Stay: Payer: BC Managed Care – PPO

## 2019-09-28 ENCOUNTER — Inpatient Hospital Stay: Payer: BC Managed Care – PPO | Admitting: Oncology

## 2019-09-30 ENCOUNTER — Telehealth: Payer: Self-pay | Admitting: Oncology

## 2019-09-30 NOTE — Telephone Encounter (Signed)
R/S NS appt for 11/11/2019 per PR okay.

## 2019-11-04 ENCOUNTER — Emergency Department
Admission: EM | Admit: 2019-11-04 | Discharge: 2019-11-04 | Disposition: A | Discharge status: Home / Self Care | Payer: BC Managed Care – PPO | Attending: Emergency Medicine | Admitting: Emergency Medicine

## 2019-11-04 ENCOUNTER — Encounter: Payer: Self-pay | Admitting: *Deleted

## 2019-11-04 ENCOUNTER — Other Ambulatory Visit: Payer: Self-pay

## 2019-11-04 ENCOUNTER — Emergency Department: Payer: BC Managed Care – PPO

## 2019-11-04 DIAGNOSIS — Z3202 Encounter for pregnancy test, result negative: Secondary | ICD-10-CM | POA: Insufficient documentation

## 2019-11-04 DIAGNOSIS — R339 Retention of urine, unspecified: Secondary | ICD-10-CM | POA: Diagnosis present

## 2019-11-04 DIAGNOSIS — R1900 Intra-abdominal and pelvic swelling, mass and lump, unspecified site: Secondary | ICD-10-CM

## 2019-11-04 DIAGNOSIS — R52 Pain, unspecified: Secondary | ICD-10-CM

## 2019-11-04 DIAGNOSIS — K5900 Constipation, unspecified: Secondary | ICD-10-CM | POA: Diagnosis not present

## 2019-11-04 DIAGNOSIS — D259 Leiomyoma of uterus, unspecified: Secondary | ICD-10-CM | POA: Insufficient documentation

## 2019-11-04 DIAGNOSIS — F17210 Nicotine dependence, cigarettes, uncomplicated: Secondary | ICD-10-CM | POA: Insufficient documentation

## 2019-11-04 LAB — BASIC METABOLIC PANEL
Anion gap: 9 (ref 5–15)
BUN: 8 mg/dL (ref 6–20)
CO2: 21 mmol/L — ABNORMAL LOW (ref 22–32)
Calcium: 8.8 mg/dL — ABNORMAL LOW (ref 8.9–10.3)
Chloride: 107 mmol/L (ref 98–111)
Creatinine, Ser: 0.52 mg/dL (ref 0.44–1.00)
GFR calc Af Amer: >60 mL/min (ref >60)
GFR calc non Af Amer: >60 mL/min (ref >60)
Glucose, Bld: 110 mg/dL — ABNORMAL HIGH (ref 70–99)
Potassium: 3.9 mmol/L (ref 3.5–5.1)
Sodium: 137 mmol/L (ref 135–145)

## 2019-11-04 LAB — URINALYSIS, COMPLETE (UACMP) WITH MICROSCOPIC
Bacteria, UA: NONE SEEN
Bilirubin Urine: NEGATIVE
Glucose, UA: NEGATIVE mg/dL
Hgb urine dipstick: NEGATIVE
Ketones, ur: NEGATIVE mg/dL
Leukocytes,Ua: NEGATIVE
Nitrite: POSITIVE — AB
Protein, ur: NEGATIVE mg/dL
Specific Gravity, Urine: 1.005 (ref 1.005–1.030)
Squamous Epithelial / HPF: NONE SEEN (ref 0–5)
WBC, UA: NONE SEEN WBC/hpf (ref 0–5)
pH: 6 (ref 5.0–8.0)

## 2019-11-04 LAB — POCT PREGNANCY, URINE: Preg Test, Ur: NEGATIVE

## 2019-11-04 LAB — CBC
HCT: 34.3 % — ABNORMAL LOW (ref 36.0–46.0)
Hemoglobin: 11.1 g/dL — ABNORMAL LOW (ref 12.0–15.0)
MCH: 26.8 pg (ref 26.0–34.0)
MCHC: 32.4 g/dL (ref 30.0–36.0)
MCV: 82.9 fL (ref 80.0–100.0)
Platelets: 217 10*3/uL (ref 150–400)
RBC: 4.14 MIL/uL (ref 3.87–5.11)
RDW: 16.3 % — ABNORMAL HIGH (ref 11.5–15.5)
WBC: 5.6 10*3/uL (ref 4.0–10.5)
nRBC: 0 % (ref 0.0–0.2)

## 2019-11-04 MED ORDER — LACTULOSE 10 GM/15ML PO SOLN: 20.0000 g | Freq: Every day | ORAL | 0 refills | Status: DC | PRN

## 2019-11-04 NOTE — ED Notes (Signed)
This RN to bedside at this time, pt's foley changed to leg bag per Dr. Kerman Passey, teaching done regarding how to change at home and supplies provided by this RN to patient to change from leg bag to drainage bag. Pt states understanding of teaching at this time.

## 2019-11-04 NOTE — ED Provider Notes (Addendum)
[facility] Department Emergency Department Provider Note   ____________________________________________   First MD Initiated Contact with Patient 11/04/19 [phone]     (approximate)  I have reviewed the triage vital signs and the nursing notes.   HISTORY  Chief Complaint Urinary Retention    HPI Ann Cox is a 43 y.o. female who presents to the ED from home with a chief complaint of urinary retention.  Patient was seen at [facility] approximately 12 days ago and placed on Keflex and Pyridium for UTI.  She is almost finished with the Keflex.  Reports last fully emptied her bladder approximately 22 hours ago.  Over the course of the day she has been dribbling small amounts of urine.  Presents to the ED in moderate distress with suprapubic abdominal pain and fullness.  Denies fever, cough, chest pain, shortness of breath, nausea, vomiting.  Last BM 2 days ago.  States usually does not have BMs except for every several days.      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   Abscess of breast 12/20/2016   ACL (anterior cruciate ligament) rupture 03/10/2014   Smoker 02/17/2014   Left knee pain 02/16/2014   History of ovarian cyst 02/16/2014    Past Surgical History:  Procedure Laterality Date   ARTHROSCOPY WITH ANTERIOR CRUCIATE LIGAMENT (ACL) REPAIR WITH ANTERIOR TIBILIAS GRAFT Left 03/10/2015   Procedure: LEFT KNEE ARTHROSCOPY MEDIAL MENISCECTOMY  ANTERIOR CRUCIATE LIGAMENT (ACL) TIBIAL ANTERIOR ALLOGRAFT;  Surgeon: Marchia Bond, MD;  Location: [facility];  Service: Orthopedics;  Laterality: Left;   KNEE ARTHROSCOPY WITH  MEDIAL MENISECTOMY Left 03/10/2015   Procedure: KNEE ARTHROSCOPY WITH MEDIAL MENISECTOMY;  Surgeon: Marchia Bond, MD;  Location: [facility];  Service: Orthopedics;  Laterality: Left;   NO PAST SURGERIES      Prior to Admission medications   Medication Sig Start Date End Date Taking? Authorizing Provider  lactulose (CHRONULAC) 10 GM/15ML solution Take 30 mLs (20 g total) by mouth daily as needed for mild constipation. 11/04/19   Paulette Blanch, MD  ZUBSOLV 2.9-0.71 MG SUBL  07/27/19   [provider]    Allergies Patient has no known allergies.  Family History  Adopted: Yes  Problem Relation Age of Onset   Alcohol abuse Maternal Uncle    Alcohol abuse Paternal Uncle    Arthritis Maternal Grandmother    Heart disease Maternal Grandmother    Anxiety disorder Maternal Grandmother    Rheum arthritis Paternal Uncle    Arthritis Mother    Hypertension Mother    Anxiety disorder Mother    Arthritis Father    Hyperlipidemia Father    Hypertension Father    Uterine cancer Maternal Aunt    Hyperlipidemia Maternal Grandfather    Breast cancer Maternal Grandfather    Anxiety disorder Maternal Aunt    Ovarian cancer Maternal Aunt     Social History Social History   Tobacco Use   Smoking status: Current Every Day Smoker    Packs/day: 0.50    Years: 14.00    Pack years: 7.00    Types: Cigarettes   Smokeless  tobacco: Never Used  Substance Use Topics   Alcohol use: Yes    Comment: occasionally   Drug use: No    Review of Systems  Constitutional: No fever/chills Eyes: No visual changes. ENT: No sore throat. Cardiovascular: Denies chest pain. Respiratory: Denies shortness of breath. Gastrointestinal: Positive for abdominal pain.  No nausea, no vomiting.  No diarrhea.  No constipation. Genitourinary: Positive for urinary retention.  Negative for dysuria. Musculoskeletal: Negative for back pain. Skin: Negative for rash. Neurological:  Negative for headaches, focal weakness or numbness.   ____________________________________________   PHYSICAL EXAM:  VITAL SIGNS: ED Triage Vitals  Enc Vitals Group     BP 11/04/19 0303 126/86     Pulse Rate 11/04/19 0303 (!) 106     Resp 11/04/19 0303 (!) 22     Temp 11/04/19 0303 99 F (37.2 C)     Temp Source 11/04/19 0303 Oral     SpO2 11/04/19 0303 100 %     Weight --      Height --      Head Circumference --      Peak Flow --      Pain Score 11/04/19 0304 9     Pain Loc --      Pain Edu? --      Excl. in [place]? --     Constitutional: Alert and oriented. Well appearing and in moderate acute distress. Eyes: Conjunctivae are normal. PERRL. EOMI. Head: Atraumatic. Nose: No congestion/rhinnorhea. Mouth/Throat: Mucous membranes are moist.  Oropharynx non-erythematous. Neck: No stridor.   Cardiovascular: Normal rate, regular rhythm. Grossly normal heart sounds.  Good peripheral circulation. Respiratory: Normal respiratory effort.  No retractions. Lungs CTAB. Gastrointestinal: Soft with lower abdominal distention and tenderness to palpation over urinary bladder without rebound or guarding. No distention. No abdominal bruits. No CVA tenderness. Musculoskeletal: No lower extremity tenderness nor edema.  No joint effusions. Neurologic:  Normal speech and language. No gross focal neurologic deficits are appreciated. No gait instability. Skin:  Skin is warm, dry and intact. No rash noted. Psychiatric: Mood and affect are normal. Speech and behavior are normal.  ____________________________________________   LABS (all labs ordered are listed, but only abnormal results are displayed)  Labs Reviewed  URINALYSIS, COMPLETE (UACMP) WITH MICROSCOPIC - Abnormal; Notable for the following components:      Result Value   Color, Urine AMBER (*)    APPearance CLEAR (*)    Nitrite POSITIVE (*)    All other components within normal limits  BASIC METABOLIC PANEL - Abnormal; Notable for  the following components:   CO2 21 (*)    Glucose, Bld 110 (*)    Calcium 8.8 (*)    All other components within normal limits  CBC - Abnormal; Notable for the following components:   Hemoglobin 11.1 (*)    HCT 34.3 (*)    RDW 16.3 (*)    All other components within normal limits  URINE CULTURE  POC URINE PREG, ED  POCT PREGNANCY, URINE   ____________________________________________  EKG  None ____________________________________________  RADIOLOGY  ED MD interpretation: CT demonstrates a large pelvic mass causing mass-effect on the urinary bladder; recommend pelvic ultrasound  Official radiology report(s): Ct Renal Stone Study  Result Date: 11/04/2019 CLINICAL DATA:  Urinary retention EXAM: CT ABDOMEN AND PELVIS WITHOUT CONTRAST TECHNIQUE: Multidetector CT imaging of the abdomen and pelvis was performed following the standard protocol without IV contrast. COMPARISON:  None. FINDINGS: LOWER CHEST: No basilar pleural or apical pericardial effusion. HEPATOBILIARY:  Normal hepatic contours. There is no intra- or extrahepatic biliary dilatation. The gallbladder is normal. PANCREAS: Normal pancreatic contours without pancreatic ductal dilatation or peripancreatic fluid collection. SPLEEN: Normal. ADRENALS/URINARY TRACT: --Adrenal glands: Normal. --Right kidney/ureter: No hydronephrosis, nephroureterolithiasis or solid renal mass. --Left kidney/ureter: No hydronephrosis, nephroureterolithiasis or solid renal mass. --Urinary bladder: Decompressed by Foley catheter. STOMACH/BOWEL: --Stomach/Duodenum: There is no hiatal hernia. The duodenal course and caliber are normal. --Small bowel: No dilatation or inflammation. --Colon: No focal abnormality. --Appendix: Normal. VASCULAR/LYMPHATIC: Normal course and caliber of the major abdominal vessels. No abdominal or pelvic lymphadenopathy. REPRODUCTIVE: There is an intermediate attenuation mass of the midline pelvis that measures 12.0 x 11.3 x 10.8 cm.  This exerts marked mass effect on the urinary bladder anteriorly. MUSCULOSKELETAL. No bony spinal canal stenosis or focal osseous abnormality. OTHER: None. IMPRESSION: Large pelvic mass causing mass effect on the urinary bladder and possibly causing bladder outlet obstruction. The mass is most likely a large uterine fibroid. Correlation with pelvic ultrasound might be helpful. Electronically Signed   By: Ulyses Jarred M.D.   On: 11/04/2019 05:09    ____________________________________________   PROCEDURES  Procedure(s) performed (including Critical Care):  Procedures   ____________________________________________   INITIAL IMPRESSION / ASSESSMENT AND PLAN / ED COURSE  As part of my medical decision making, I reviewed the following data within the [place] notes reviewed and incorporated, Labs reviewed, Old chart reviewed, Radiograph reviewed and Notes from prior ED visits     Katasha Riga was evaluated in Emergency Department on 11/04/2019 for the symptoms described in the history of present illness. She was evaluated in the context of the global COVID-19 pandemic, which necessitated consideration that the patient might be at risk for infection with the SARS-CoV-2 virus that causes COVID-19. Institutional protocols and algorithms that pertain to the evaluation of patients at risk for COVID-19 are in a state of rapid change based on information released by regulatory bodies including the CDC and federal and state organizations. These policies and algorithms were followed during the patient's care in the ED.    43 year old female who presents with urinary retention in the setting of being treated for UTI with Keflex. Differential diagnosis includes, but is not limited to, ovarian cyst, ovarian torsion, acute appendicitis, diverticulitis, urinary tract infection/pyelonephritis, endometriosis, bowel obstruction, colitis, renal colic, gastroenteritis, hernia, fibroids,  endometriosis, pregnancy related pain including ectopic pregnancy, etc.  Will obtain bladder scan, basic lab work, urinalysis.  Consider CT renal colic study.  Clinical Course as of Nov 04 707  Thu Nov 04, 2019  0321 Bladder scan reveals >932m in the bladder.  Nursing to place Foley catheter.   [JS]  [id number] patient on CT results.  Will order pelvic ultrasound.   [JS]  [id number] awaiting pelvic ultrasound.  Care transferred to Dr. PKerman Passeyat change of shift.  Anticipate discharge home with Foley catheter in place with urology and gynecology follow-ups.   [JS]    Clinical Course User Index [JS] SPaulette Blanch MD     ____________________________________________   FINAL CLINICAL IMPRESSION(S) / ED DIAGNOSES  Final diagnoses:  Urinary retention  Pain  Constipation, unspecified constipation type     ED Discharge Orders         Ordered    lactulose ([place] 10 GM/15ML solution  Daily PRN     11/04/19 0650           Note:  This document was prepared using Dragon voice recognition software and  may include unintentional dictation errors.   Paulette Blanch, MD 11/04/19 3845    Paulette Blanch, MD 11/04/19 [phone]

## 2019-11-04 NOTE — ED Notes (Signed)
This Rn to bedside at this time. NAD noted at this time. Pt resting in bed with SO at bedside. Foley emptied by this RN, 1500cc's emptied from foley at this time. Pt tolerated well. Will continue to monitor for further patient needs.

## 2019-11-04 NOTE — Discharge Instructions (Addendum)
Keep Foley catheter in place until seen by the urologist. Take Lactulose as needed for bowel movements. Return to the ER for worsening symptoms, persistent vomiting, difficulty breathing or other concerns.

## 2019-11-04 NOTE — ED Triage Notes (Signed)
Pt reports she is being treated for UTI, she is currently taking Keflex. Reports last emptied her bladder around 0500 yesterday morning, only small amounts since then, has taken 4 doses of AZO today. Lower abdominal pressure and bloating. She denies fevers.

## 2019-11-04 NOTE — ED Provider Notes (Signed)
-----------------------------------------   8:25 AM on 11/04/2019 -----------------------------------------  Patient care assumed from Dr. Beather Arbour.  Ultrasound consistent with a leiomyoma, otherwise largely negative ultrasound.  Largely myoma is likely the cause of the patient's retention.  She has a leg bag with a catheter in place.  We will discharge with GYN and urology follow-up.  Patient agreeable to plan of care.   Harvest Dark, MD 11/04/19 [phone]

## 2019-11-04 NOTE — ED Notes (Signed)
NAD noted at time of D/C. Pt denies questions or concerns. Pt ambulatory to the lobby at this time.  

## 2019-11-05 LAB — URINE CULTURE: Culture: NO GROWTH

## 2019-11-11 ENCOUNTER — Ambulatory Visit: Payer: Self-pay | Admitting: Physician Assistant

## 2019-11-11 ENCOUNTER — Encounter: Payer: Self-pay | Admitting: Urology

## 2019-11-11 ENCOUNTER — Ambulatory Visit: Payer: BC Managed Care – PPO | Admitting: Urology

## 2019-11-11 ENCOUNTER — Ambulatory Visit: Payer: BC Managed Care – PPO | Admitting: Oncology

## 2019-11-11 ENCOUNTER — Other Ambulatory Visit: Payer: BC Managed Care – PPO

## 2019-11-11 ENCOUNTER — Other Ambulatory Visit: Payer: Self-pay

## 2019-11-11 VITALS — BP 136/79 | HR 73 | Ht 63.0 in | Wt 142.0 lb

## 2019-11-11 DIAGNOSIS — R339 Retention of urine, unspecified: Secondary | ICD-10-CM

## 2019-11-11 DIAGNOSIS — Z466 Encounter for fitting and adjustment of urinary device: Secondary | ICD-10-CM

## 2019-11-11 MED ORDER — CIPROFLOXACIN HCL 500 MG PO TABS
500.0000 mg | ORAL_TABLET | Freq: Once | ORAL | Status: AC
  Administered 2019-11-11: 500 mg via ORAL

## 2019-11-11 NOTE — Progress Notes (Signed)
Catheter Removal  Patient is present today for a catheter removal.  74ml of water was drained from the balloon. A 16FR foley cath was removed from the bladder no complications were noted. Per verbal orders from Dr.Stoioff patient given on time dose of Cipro 500mg  in clinic. Patient tolerated well.  Performed by: Gordy Clement, CMA   Follow up/ Additional notes: RTC as scheduled this afternoon

## 2019-11-11 NOTE — Progress Notes (Signed)
11/11/2019 8:39 AM   Bing Neighbors 28-Jul-1976 [id number]  Referring provider: No referring provider defined for this encounter.  Chief Complaint  Patient presents with  . Hospitalization Follow-up    HPI: Ann Cox is a 43 y.o. female who presented to the ED on 11/04/2019 with acute urinary retention.  She was started on antibiotics for a UTI at [facility] approximately 1.5 weeks prior to her ED visit.  She had acute onset of urinary hesitancy and voiding small amounts of urine associated with suprapubic discomfort/bladder fullness.  She was found to be in urinary retention and a Foley catheter was placed with resolution of her symptoms.  A CT scan was performed which showed a 11 cm pelvic mass felt to be a leiomyoma.  The mass did extend posterior to the bladder and it was felt this could be the source of her urinary retention.  She has a history of recurrent UTIs but no prior episodes of urinary retention.  She has not made her gynecology appointment and wanted to be seen here first regarding her retention.   PMH: Past Medical History:  Diagnosis Date  . ACL (anterior cruciate ligament) rupture 03/10/2014  . ACL sprain 02/2015   left  . H/O urinary retention   . History of recurrent UTI (urinary tract infection)   . Medial meniscus tear 02/2015   left knee    Surgical History: Past Surgical History:  Procedure Laterality Date  . ARTHROSCOPY WITH ANTERIOR CRUCIATE LIGAMENT (ACL) REPAIR WITH ANTERIOR TIBILIAS GRAFT Left 03/10/2015   Procedure: LEFT KNEE ARTHROSCOPY MEDIAL MENISCECTOMY  ANTERIOR CRUCIATE LIGAMENT (ACL) TIBIAL ANTERIOR ALLOGRAFT;  Surgeon: Marchia Bond, MD;  Location: [facility];  Service: Orthopedics;  Laterality: Left;  . KNEE ARTHROSCOPY WITH MEDIAL MENISECTOMY Left 03/10/2015   Procedure: KNEE ARTHROSCOPY WITH MEDIAL MENISECTOMY;  Surgeon: Marchia Bond, MD;  Location: [facility];  Service: Orthopedics;  Laterality: Left;  . NO PAST  SURGERIES      Home Medications:  Allergies as of 11/11/2019   No Known Allergies     Medication List       Accurate as of November 11, 2019  8:39 AM. If you have any questions, ask your nurse or doctor.        lactulose 10 GM/15ML solution Commonly known as: CHRONULAC Take 30 mLs (20 g total) by mouth daily as needed for mild constipation.   Zubsolv 2.9-0.71 MG Subl Generic drug: Buprenorphine HCl-Naloxone HCl       Allergies: No Known Allergies  Family History: Family History  Adopted: Yes  Problem Relation Age of Onset  . Alcohol abuse Maternal Uncle   . Alcohol abuse Paternal Uncle   . Arthritis Maternal Grandmother   . Heart disease Maternal Grandmother   . Anxiety disorder Maternal Grandmother   . Rheum arthritis Paternal Uncle   . Arthritis Mother   . Hypertension Mother   . Anxiety disorder Mother   . Arthritis Father   . Hyperlipidemia Father   . Hypertension Father   . Uterine cancer Maternal Aunt   . Hyperlipidemia Maternal Grandfather   . Breast cancer Maternal Grandfather   . Anxiety disorder Maternal Aunt   . Ovarian cancer Maternal Aunt     Social History:  reports that she has been smoking cigarettes. She has a 7.00 pack-year smoking history. She has never used smokeless tobacco. She reports current alcohol use. She reports that she does not use drugs.  ROS: UROLOGY Frequent Urination?: No Hard to  postpone urination?: No Burning/pain with urination?: Yes Get up at night to urinate?: No Leakage of urine?: Yes Urine stream starts and stops?: No Trouble starting stream?: Yes Do you have to strain to urinate?: Yes Blood in urine?: No Urinary tract infection?: Yes Sexually transmitted disease?: No Injury to kidneys or bladder?: No Painful intercourse?: No Weak stream?: No Currently pregnant?: No Vaginal bleeding?: No Last menstrual period?: N/A  Gastrointestinal Nausea?: No Vomiting?: No Indigestion/heartburn?: No Diarrhea?: No  Constipation?: Yes  Constitutional Fever: No Night sweats?: No Weight loss?: No Fatigue?: No  Skin Skin rash/lesions?: No Itching?: No  Eyes Blurred vision?: No Double vision?: No  Ears/Nose/Throat Sore throat?: No Sinus problems?: No  Hematologic/Lymphatic Swollen glands?: No Easy bruising?: No  Cardiovascular Leg swelling?: No Chest pain?: No  Respiratory Cough?: No Shortness of breath?: No  Endocrine Excessive thirst?: No  Musculoskeletal Back pain?: Yes Joint pain?: No  Neurological Headaches?: No Dizziness?: No  Psychologic Depression?: No Anxiety?: Yes  Physical Exam: BP 136/79 (BP Location: Left Arm, Patient Position: Sitting, Cuff Size: Normal)   Pulse 73   Ht 5\' 3"  (1.6 m)   Wt 142 lb (64.4 kg)   BMI 25.15 kg/m   Constitutional:  Alert and oriented, No acute distress. HEENT: [place] AT, moist mucus membranes.  Trachea midline, no masses. Cardiovascular: No clubbing, cyanosis, or edema. Respiratory: Normal respiratory effort, no increased work of breathing. Skin: No rashes, bruises or suspicious lesions. Neurologic: Grossly intact, no focal deficits, moving all 4 extremities. Psychiatric: Normal mood and affect.   Assessment & Plan:    - Urinary retention Possibly secondary to large uterine leiomyoma.  Foley catheter was removed this morning for voiding trial.  She has a follow-up appointment with Thomas Hoff at 1:00 this afternoon for bladder scan.  If she has recurrent retention she is interested in learning intermittent catheterization.   Abbie Sons, [place] [address] [place],  [address] [phone]

## 2020-01-26 ENCOUNTER — Telehealth: Payer: Self-pay

## 2020-01-26 NOTE — Telephone Encounter (Signed)
Pt calling triage stating she needs an appt to get hysterectomy? Tried to call pt back. She needs an appt. Pt did not answer. Please schedule when she calls back

## 2020-12-22 IMAGING — CT CT RENAL STONE PROTOCOL
3 of 4 series · 8 of 46 positions shown, 15 images · non-contrast
Comparison: None.

CLINICAL DATA: Urinary retention

EXAM:
CT ABDOMEN AND PELVIS WITHOUT CONTRAST
TECHNIQUE: Multidetector CT imaging of the abdomen and pelvis was performed
following the standard protocol without IV contrast.

[Series 4: lung bases · axial · 0.74mm/px · z∈[-868,-808]mm · 4 of 20 slices shown, 9 images]
[im 4/20  soft-tissue]
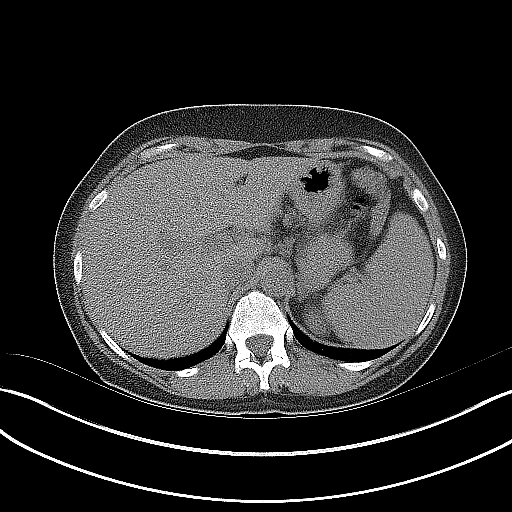
[im 4/20  lung]
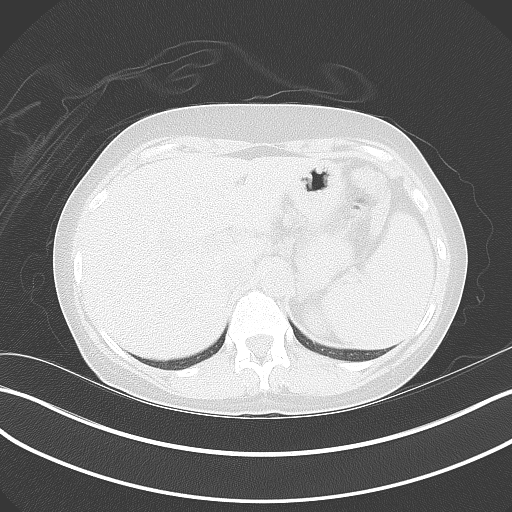
[im 4/20  bone]
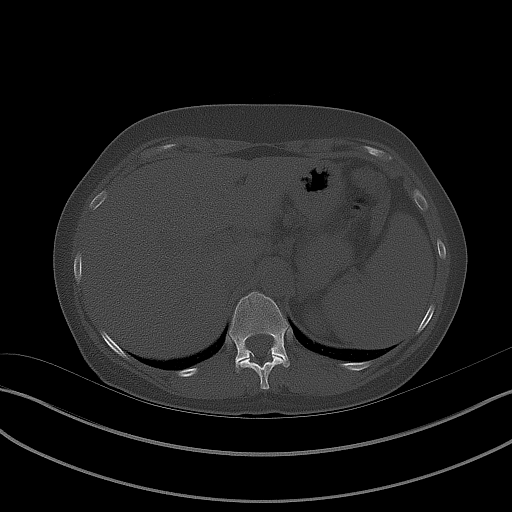
[im 8/20  soft-tissue]
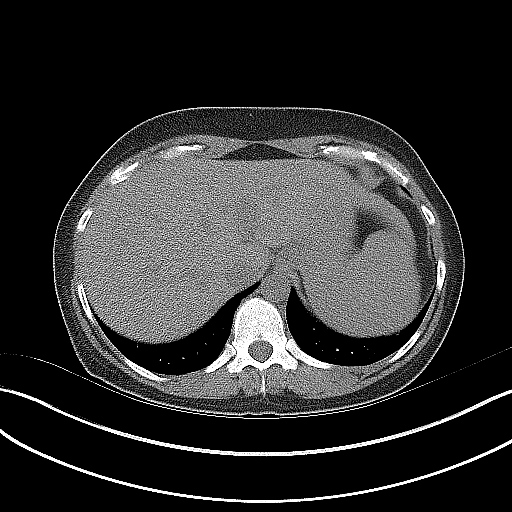
[im 8/20  lung]
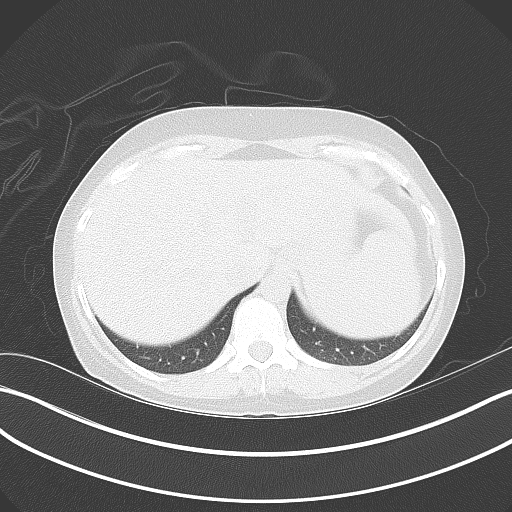
[im 12/20  soft-tissue]
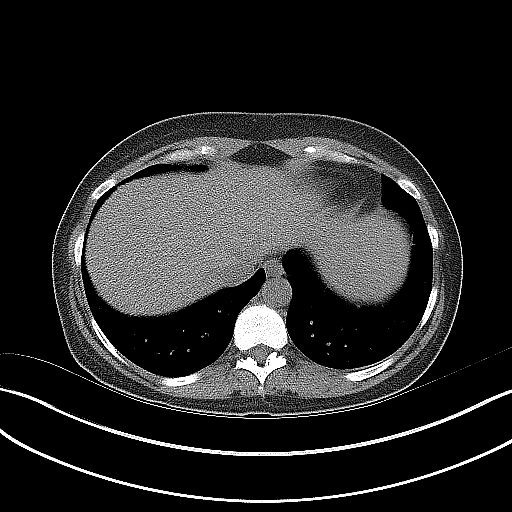
[im 12/20  lung]
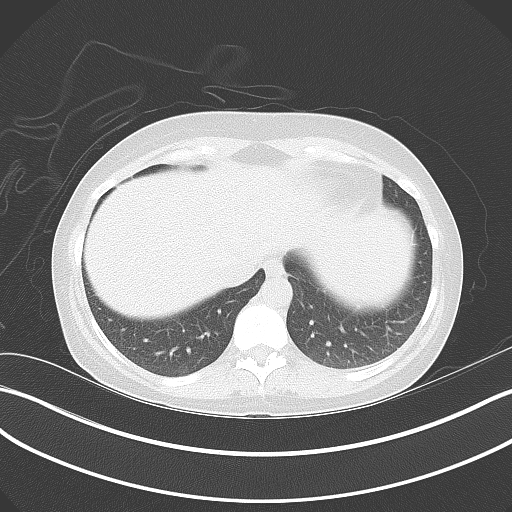
[im 16/20  soft-tissue]
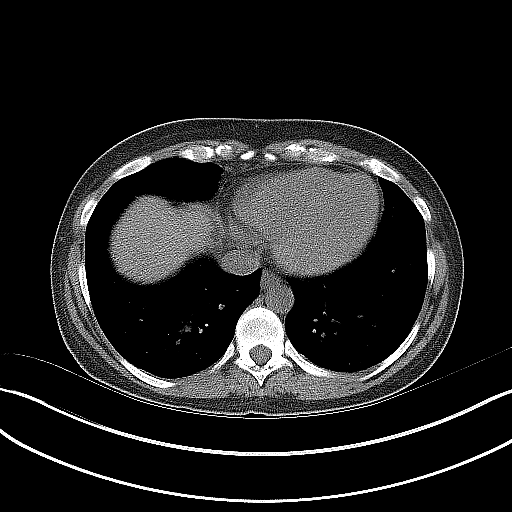
[im 16/20  lung]
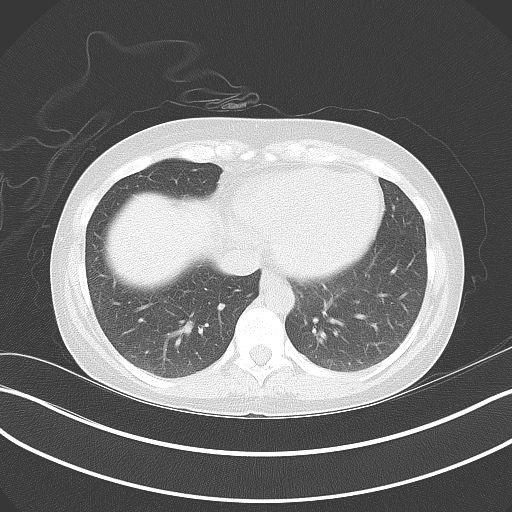

[Series 5: coronal · coronal · 0.69mm/px · 3 of 106 slices shown, 4 images]
[im 36/106  soft-tissue]
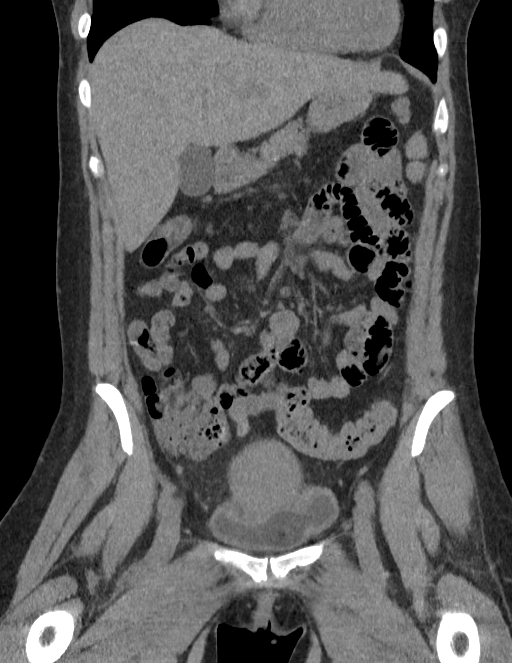
[im 47/106  soft-tissue]
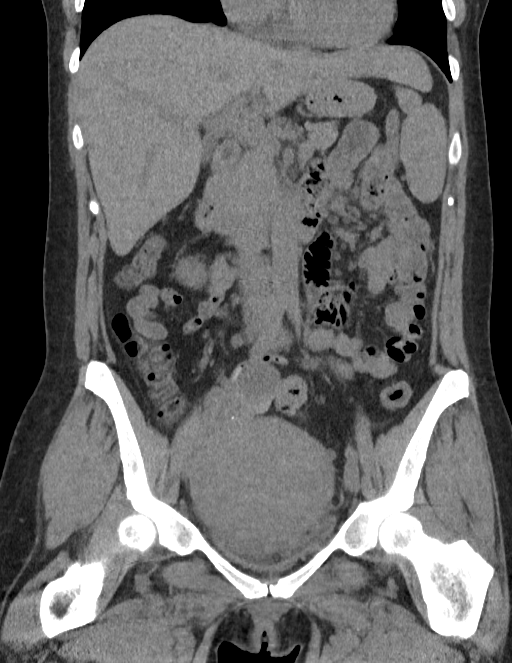
[im 47/106  bone]
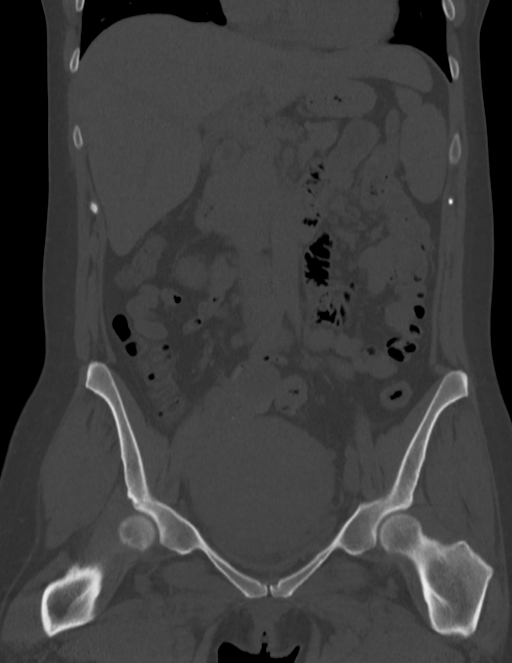
[im 59/106  soft-tissue]
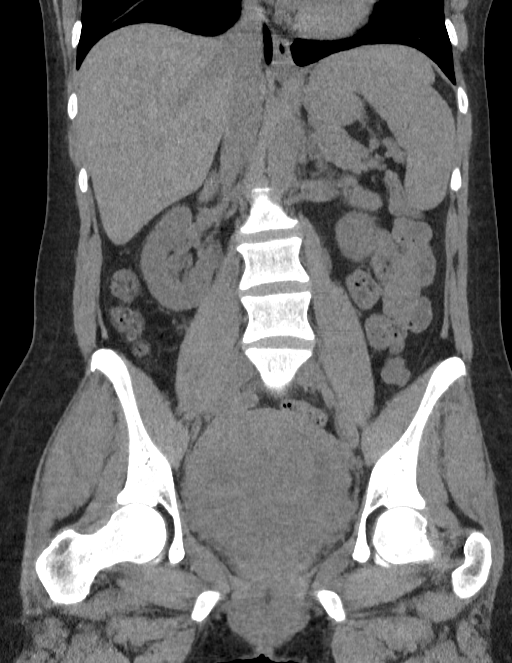

[Series 6: sagittal · sagittal · 0.53mm/px · 1 of 164 slices shown, 2 images]
[im 55/164  soft-tissue]
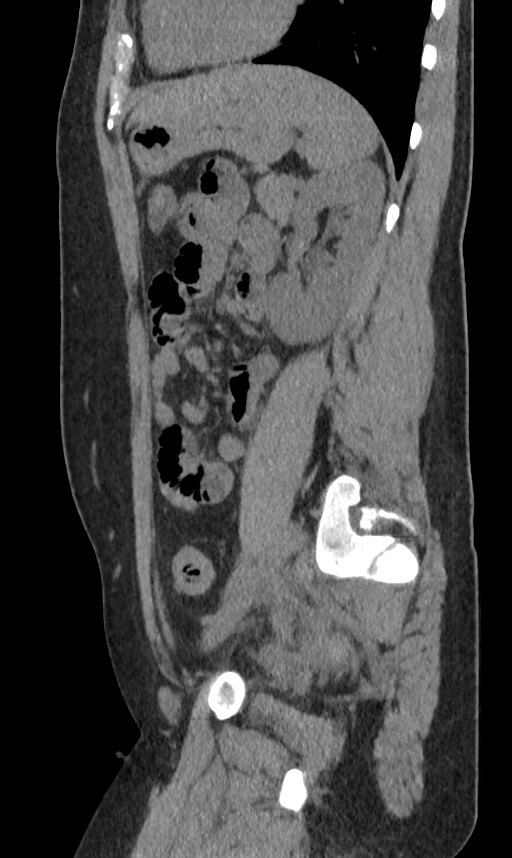
[im 55/164  bone]
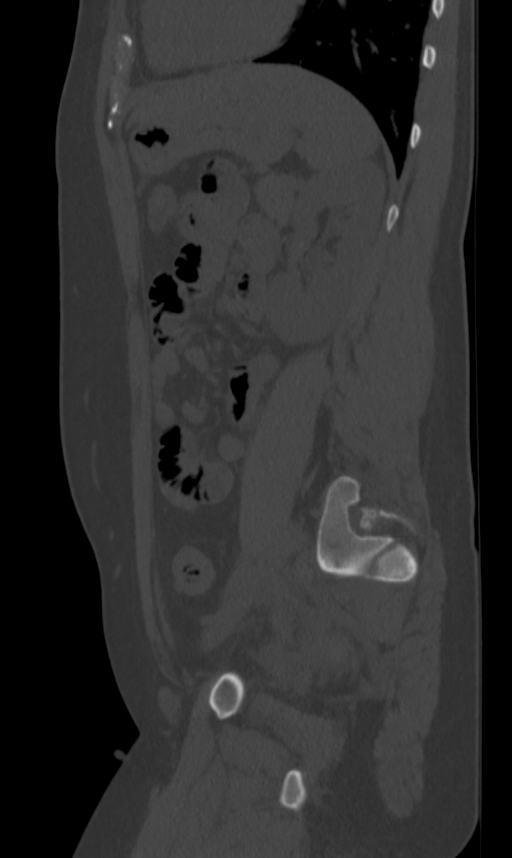

[8 of 46 positions shown; findings below may reference images not displayed]

FINDINGS: LOWER CHEST: No basilar pleural or apical pericardial effusion.

HEPATOBILIARY: Normal hepatic contours. There is no intra- or
extrahepatic biliary dilatation. The gallbladder is normal.

PANCREAS: Normal pancreatic contours without pancreatic ductal
dilatation or peripancreatic fluid collection.

SPLEEN: Normal.

ADRENALS/URINARY TRACT:

--Adrenal glands: Normal.

--Right kidney/ureter: No hydronephrosis, nephroureterolithiasis or
solid renal mass.

--Left kidney/ureter: No hydronephrosis, nephroureterolithiasis or
solid renal mass.

--Urinary bladder: Decompressed by Foley catheter.

STOMACH/BOWEL:

--Stomach/Duodenum: There is no hiatal hernia. The duodenal course
and caliber are normal.

--Small bowel: No dilatation or inflammation.

--Colon: No focal abnormality.

--Appendix: Normal.

VASCULAR/LYMPHATIC: Normal course and caliber of the major abdominal
vessels. No abdominal or pelvic lymphadenopathy.

REPRODUCTIVE: There is an intermediate attenuation mass of the
midline pelvis that measures 12.0 x 11.3 x 10.8 cm. This exerts
marked mass effect on the urinary bladder anteriorly.

MUSCULOSKELETAL. No bony spinal canal stenosis or focal osseous
abnormality.

OTHER: None.
IMPRESSION: Large pelvic mass causing mass effect on the urinary bladder and
possibly causing bladder outlet obstruction. The mass is most likely
a large uterine fibroid. Correlation with pelvic ultrasound might be
helpful.

## 2021-03-14 DIAGNOSIS — F1120 Opioid dependence, uncomplicated: Secondary | ICD-10-CM | POA: Diagnosis not present

## 2021-03-14 DIAGNOSIS — Z79891 Long term (current) use of opiate analgesic: Secondary | ICD-10-CM | POA: Diagnosis not present

## 2021-03-14 DIAGNOSIS — F1121 Opioid dependence, in remission: Secondary | ICD-10-CM | POA: Diagnosis not present

## 2021-03-14 DIAGNOSIS — Z7151 Drug abuse counseling and surveillance of drug abuser: Secondary | ICD-10-CM | POA: Diagnosis not present

## 2021-03-14 DIAGNOSIS — F17210 Nicotine dependence, cigarettes, uncomplicated: Secondary | ICD-10-CM | POA: Diagnosis not present

## 2021-04-25 DIAGNOSIS — Z79891 Long term (current) use of opiate analgesic: Secondary | ICD-10-CM | POA: Diagnosis not present

## 2021-04-25 DIAGNOSIS — Z7151 Drug abuse counseling and surveillance of drug abuser: Secondary | ICD-10-CM | POA: Diagnosis not present

## 2021-04-25 DIAGNOSIS — F17210 Nicotine dependence, cigarettes, uncomplicated: Secondary | ICD-10-CM | POA: Diagnosis not present

## 2021-04-25 DIAGNOSIS — F1121 Opioid dependence, in remission: Secondary | ICD-10-CM | POA: Diagnosis not present

## 2021-05-23 DIAGNOSIS — F1120 Opioid dependence, uncomplicated: Secondary | ICD-10-CM | POA: Diagnosis not present

## 2021-05-23 DIAGNOSIS — Z7151 Drug abuse counseling and surveillance of drug abuser: Secondary | ICD-10-CM | POA: Diagnosis not present

## 2021-05-23 DIAGNOSIS — F17210 Nicotine dependence, cigarettes, uncomplicated: Secondary | ICD-10-CM | POA: Diagnosis not present

## 2021-05-23 DIAGNOSIS — Z79891 Long term (current) use of opiate analgesic: Secondary | ICD-10-CM | POA: Diagnosis not present

## 2021-05-23 DIAGNOSIS — F1121 Opioid dependence, in remission: Secondary | ICD-10-CM | POA: Diagnosis not present

## 2021-06-27 DIAGNOSIS — F1120 Opioid dependence, uncomplicated: Secondary | ICD-10-CM | POA: Diagnosis not present

## 2021-06-27 DIAGNOSIS — F17210 Nicotine dependence, cigarettes, uncomplicated: Secondary | ICD-10-CM | POA: Diagnosis not present

## 2021-06-27 DIAGNOSIS — F1121 Opioid dependence, in remission: Secondary | ICD-10-CM | POA: Diagnosis not present

## 2021-06-27 DIAGNOSIS — Z7151 Drug abuse counseling and surveillance of drug abuser: Secondary | ICD-10-CM | POA: Diagnosis not present

## 2021-06-27 DIAGNOSIS — Z79891 Long term (current) use of opiate analgesic: Secondary | ICD-10-CM | POA: Diagnosis not present

## 2021-07-24 DIAGNOSIS — F1121 Opioid dependence, in remission: Secondary | ICD-10-CM | POA: Diagnosis not present

## 2021-07-24 DIAGNOSIS — Z79891 Long term (current) use of opiate analgesic: Secondary | ICD-10-CM | POA: Diagnosis not present

## 2021-07-24 DIAGNOSIS — F1120 Opioid dependence, uncomplicated: Secondary | ICD-10-CM | POA: Diagnosis not present

## 2021-07-24 DIAGNOSIS — F17210 Nicotine dependence, cigarettes, uncomplicated: Secondary | ICD-10-CM | POA: Diagnosis not present

## 2021-07-24 DIAGNOSIS — Z7151 Drug abuse counseling and surveillance of drug abuser: Secondary | ICD-10-CM | POA: Diagnosis not present

## 2021-08-21 DIAGNOSIS — Z7151 Drug abuse counseling and surveillance of drug abuser: Secondary | ICD-10-CM | POA: Diagnosis not present

## 2021-08-21 DIAGNOSIS — Z79891 Long term (current) use of opiate analgesic: Secondary | ICD-10-CM | POA: Diagnosis not present

## 2021-08-21 DIAGNOSIS — F1120 Opioid dependence, uncomplicated: Secondary | ICD-10-CM | POA: Diagnosis not present

## 2021-08-21 DIAGNOSIS — F17210 Nicotine dependence, cigarettes, uncomplicated: Secondary | ICD-10-CM | POA: Diagnosis not present

## 2021-08-21 DIAGNOSIS — F1121 Opioid dependence, in remission: Secondary | ICD-10-CM | POA: Diagnosis not present

## 2021-09-10 DIAGNOSIS — W5501XA Bitten by cat, initial encounter: Secondary | ICD-10-CM | POA: Diagnosis not present

## 2021-09-10 DIAGNOSIS — S61259A Open bite of unspecified finger without damage to nail, initial encounter: Secondary | ICD-10-CM | POA: Diagnosis not present

## 2021-09-18 DIAGNOSIS — Z7151 Drug abuse counseling and surveillance of drug abuser: Secondary | ICD-10-CM | POA: Diagnosis not present

## 2021-09-18 DIAGNOSIS — F1121 Opioid dependence, in remission: Secondary | ICD-10-CM | POA: Diagnosis not present

## 2021-09-18 DIAGNOSIS — F1120 Opioid dependence, uncomplicated: Secondary | ICD-10-CM | POA: Diagnosis not present

## 2021-09-18 DIAGNOSIS — Z79891 Long term (current) use of opiate analgesic: Secondary | ICD-10-CM | POA: Diagnosis not present

## 2021-09-18 DIAGNOSIS — F17210 Nicotine dependence, cigarettes, uncomplicated: Secondary | ICD-10-CM | POA: Diagnosis not present

## 2021-10-17 DIAGNOSIS — Z7151 Drug abuse counseling and surveillance of drug abuser: Secondary | ICD-10-CM | POA: Diagnosis not present

## 2021-10-17 DIAGNOSIS — F1120 Opioid dependence, uncomplicated: Secondary | ICD-10-CM | POA: Diagnosis not present

## 2021-10-17 DIAGNOSIS — F17210 Nicotine dependence, cigarettes, uncomplicated: Secondary | ICD-10-CM | POA: Diagnosis not present

## 2021-10-17 DIAGNOSIS — F1121 Opioid dependence, in remission: Secondary | ICD-10-CM | POA: Diagnosis not present

## 2021-10-17 DIAGNOSIS — Z79891 Long term (current) use of opiate analgesic: Secondary | ICD-10-CM | POA: Diagnosis not present

## 2021-11-14 DIAGNOSIS — F1120 Opioid dependence, uncomplicated: Secondary | ICD-10-CM | POA: Diagnosis not present

## 2021-11-14 DIAGNOSIS — F1121 Opioid dependence, in remission: Secondary | ICD-10-CM | POA: Diagnosis not present

## 2021-11-14 DIAGNOSIS — Z79891 Long term (current) use of opiate analgesic: Secondary | ICD-10-CM | POA: Diagnosis not present

## 2021-11-14 DIAGNOSIS — F17210 Nicotine dependence, cigarettes, uncomplicated: Secondary | ICD-10-CM | POA: Diagnosis not present

## 2021-11-14 DIAGNOSIS — Z7151 Drug abuse counseling and surveillance of drug abuser: Secondary | ICD-10-CM | POA: Diagnosis not present

## 2021-12-12 DIAGNOSIS — F1121 Opioid dependence, in remission: Secondary | ICD-10-CM | POA: Diagnosis not present

## 2021-12-12 DIAGNOSIS — Z7151 Drug abuse counseling and surveillance of drug abuser: Secondary | ICD-10-CM | POA: Diagnosis not present

## 2021-12-12 DIAGNOSIS — F17210 Nicotine dependence, cigarettes, uncomplicated: Secondary | ICD-10-CM | POA: Diagnosis not present

## 2021-12-12 DIAGNOSIS — Z79891 Long term (current) use of opiate analgesic: Secondary | ICD-10-CM | POA: Diagnosis not present

## 2021-12-12 DIAGNOSIS — F1120 Opioid dependence, uncomplicated: Secondary | ICD-10-CM | POA: Diagnosis not present

## 2022-01-09 DIAGNOSIS — Z7151 Drug abuse counseling and surveillance of drug abuser: Secondary | ICD-10-CM | POA: Diagnosis not present

## 2022-01-09 DIAGNOSIS — Z79891 Long term (current) use of opiate analgesic: Secondary | ICD-10-CM | POA: Diagnosis not present

## 2022-01-09 DIAGNOSIS — F17210 Nicotine dependence, cigarettes, uncomplicated: Secondary | ICD-10-CM | POA: Diagnosis not present

## 2022-01-09 DIAGNOSIS — F1120 Opioid dependence, uncomplicated: Secondary | ICD-10-CM | POA: Diagnosis not present

## 2022-01-09 DIAGNOSIS — F1121 Opioid dependence, in remission: Secondary | ICD-10-CM | POA: Diagnosis not present

## 2022-02-06 DIAGNOSIS — Z7151 Drug abuse counseling and surveillance of drug abuser: Secondary | ICD-10-CM | POA: Diagnosis not present

## 2022-02-06 DIAGNOSIS — Z79891 Long term (current) use of opiate analgesic: Secondary | ICD-10-CM | POA: Diagnosis not present

## 2022-02-06 DIAGNOSIS — F1120 Opioid dependence, uncomplicated: Secondary | ICD-10-CM | POA: Diagnosis not present

## 2022-02-06 DIAGNOSIS — F17210 Nicotine dependence, cigarettes, uncomplicated: Secondary | ICD-10-CM | POA: Diagnosis not present

## 2022-02-06 DIAGNOSIS — F1121 Opioid dependence, in remission: Secondary | ICD-10-CM | POA: Diagnosis not present

## 2022-03-06 DIAGNOSIS — Z79891 Long term (current) use of opiate analgesic: Secondary | ICD-10-CM | POA: Diagnosis not present

## 2022-03-06 DIAGNOSIS — F1121 Opioid dependence, in remission: Secondary | ICD-10-CM | POA: Diagnosis not present

## 2022-03-06 DIAGNOSIS — F17210 Nicotine dependence, cigarettes, uncomplicated: Secondary | ICD-10-CM | POA: Diagnosis not present

## 2022-03-06 DIAGNOSIS — F1120 Opioid dependence, uncomplicated: Secondary | ICD-10-CM | POA: Diagnosis not present

## 2022-03-06 DIAGNOSIS — Z7151 Drug abuse counseling and surveillance of drug abuser: Secondary | ICD-10-CM | POA: Diagnosis not present

## 2022-04-03 DIAGNOSIS — F1120 Opioid dependence, uncomplicated: Secondary | ICD-10-CM | POA: Diagnosis not present

## 2022-04-03 DIAGNOSIS — Z7151 Drug abuse counseling and surveillance of drug abuser: Secondary | ICD-10-CM | POA: Diagnosis not present

## 2022-04-03 DIAGNOSIS — F1121 Opioid dependence, in remission: Secondary | ICD-10-CM | POA: Diagnosis not present

## 2022-04-03 DIAGNOSIS — Z79891 Long term (current) use of opiate analgesic: Secondary | ICD-10-CM | POA: Diagnosis not present

## 2022-04-30 DIAGNOSIS — F1120 Opioid dependence, uncomplicated: Secondary | ICD-10-CM | POA: Diagnosis not present

## 2022-05-01 DIAGNOSIS — Z79891 Long term (current) use of opiate analgesic: Secondary | ICD-10-CM | POA: Diagnosis not present

## 2022-05-01 DIAGNOSIS — Z7151 Drug abuse counseling and surveillance of drug abuser: Secondary | ICD-10-CM | POA: Diagnosis not present

## 2022-05-01 DIAGNOSIS — F1121 Opioid dependence, in remission: Secondary | ICD-10-CM | POA: Diagnosis not present

## 2022-06-12 DIAGNOSIS — Z7151 Drug abuse counseling and surveillance of drug abuser: Secondary | ICD-10-CM | POA: Diagnosis not present

## 2022-06-12 DIAGNOSIS — Z79891 Long term (current) use of opiate analgesic: Secondary | ICD-10-CM | POA: Diagnosis not present

## 2022-06-12 DIAGNOSIS — F1121 Opioid dependence, in remission: Secondary | ICD-10-CM | POA: Diagnosis not present

## 2022-06-12 DIAGNOSIS — F1120 Opioid dependence, uncomplicated: Secondary | ICD-10-CM | POA: Diagnosis not present

## 2022-07-10 DIAGNOSIS — F1190 Opioid use, unspecified, uncomplicated: Secondary | ICD-10-CM | POA: Diagnosis not present

## 2022-07-10 DIAGNOSIS — F1120 Opioid dependence, uncomplicated: Secondary | ICD-10-CM | POA: Diagnosis not present

## 2022-07-10 DIAGNOSIS — Z7151 Drug abuse counseling and surveillance of drug abuser: Secondary | ICD-10-CM | POA: Diagnosis not present

## 2022-07-10 DIAGNOSIS — Z79891 Long term (current) use of opiate analgesic: Secondary | ICD-10-CM | POA: Diagnosis not present

## 2022-07-10 DIAGNOSIS — F17210 Nicotine dependence, cigarettes, uncomplicated: Secondary | ICD-10-CM | POA: Diagnosis not present

## 2022-08-07 DIAGNOSIS — Z7151 Drug abuse counseling and surveillance of drug abuser: Secondary | ICD-10-CM | POA: Diagnosis not present

## 2022-08-07 DIAGNOSIS — F1120 Opioid dependence, uncomplicated: Secondary | ICD-10-CM | POA: Diagnosis not present

## 2022-08-07 DIAGNOSIS — Z79891 Long term (current) use of opiate analgesic: Secondary | ICD-10-CM | POA: Diagnosis not present

## 2022-09-03 DIAGNOSIS — Z7151 Drug abuse counseling and surveillance of drug abuser: Secondary | ICD-10-CM | POA: Diagnosis not present

## 2022-09-04 DIAGNOSIS — F1120 Opioid dependence, uncomplicated: Secondary | ICD-10-CM | POA: Diagnosis not present

## 2022-10-01 DIAGNOSIS — Z79899 Other long term (current) drug therapy: Secondary | ICD-10-CM | POA: Diagnosis not present

## 2022-10-02 DIAGNOSIS — Z7151 Drug abuse counseling and surveillance of drug abuser: Secondary | ICD-10-CM | POA: Diagnosis not present

## 2022-10-02 DIAGNOSIS — F17210 Nicotine dependence, cigarettes, uncomplicated: Secondary | ICD-10-CM | POA: Diagnosis not present

## 2022-10-02 DIAGNOSIS — Z79891 Long term (current) use of opiate analgesic: Secondary | ICD-10-CM | POA: Diagnosis not present

## 2022-10-02 DIAGNOSIS — F1111 Opioid abuse, in remission: Secondary | ICD-10-CM | POA: Diagnosis not present

## 2022-10-30 DIAGNOSIS — Z7151 Drug abuse counseling and surveillance of drug abuser: Secondary | ICD-10-CM | POA: Diagnosis not present

## 2022-10-30 DIAGNOSIS — Z79891 Long term (current) use of opiate analgesic: Secondary | ICD-10-CM | POA: Diagnosis not present

## 2022-11-15 DIAGNOSIS — E611 Iron deficiency: Secondary | ICD-10-CM | POA: Diagnosis not present

## 2022-11-15 DIAGNOSIS — R609 Edema, unspecified: Secondary | ICD-10-CM | POA: Diagnosis not present

## 2022-11-15 DIAGNOSIS — Z1389 Encounter for screening for other disorder: Secondary | ICD-10-CM | POA: Diagnosis not present

## 2022-11-15 DIAGNOSIS — Z6823 Body mass index (BMI) 23.0-23.9, adult: Secondary | ICD-10-CM | POA: Diagnosis not present

## 2022-11-15 DIAGNOSIS — N921 Excessive and frequent menstruation with irregular cycle: Secondary | ICD-10-CM | POA: Diagnosis not present

## 2022-11-15 DIAGNOSIS — R600 Localized edema: Secondary | ICD-10-CM | POA: Diagnosis not present

## 2022-11-18 ENCOUNTER — Encounter: Payer: Self-pay | Admitting: Emergency Medicine

## 2022-11-18 ENCOUNTER — Emergency Department
Admission: EM | Admit: 2022-11-18 | Discharge: 2022-11-19 | Disposition: A | Discharge status: Home / Self Care | Payer: BC Managed Care – PPO | Attending: Emergency Medicine | Admitting: Emergency Medicine

## 2022-11-18 DIAGNOSIS — D649 Anemia, unspecified: Secondary | ICD-10-CM | POA: Diagnosis not present

## 2022-11-18 DIAGNOSIS — R42 Dizziness and giddiness: Secondary | ICD-10-CM | POA: Diagnosis not present

## 2022-11-18 LAB — COMPREHENSIVE METABOLIC PANEL
ALT: 9 U/L (ref 0–44)
AST: 15 U/L (ref 15–41)
Albumin: 4.1 g/dL (ref 3.5–5.0)
Alkaline Phosphatase: 71 U/L (ref 38–126)
Anion gap: 7 (ref 5–15)
BUN: 8 mg/dL (ref 6–20)
CO2: 24 mmol/L (ref 22–32)
Calcium: 9.2 mg/dL (ref 8.9–10.3)
Chloride: 107 mmol/L (ref 98–111)
Creatinine, Ser: 0.66 mg/dL (ref 0.44–1.00)
GFR, Estimated: >60 mL/min (ref >60)
Glucose, Bld: 116 mg/dL — ABNORMAL HIGH (ref 70–99)
Potassium: 3.7 mmol/L (ref 3.5–5.1)
Sodium: 138 mmol/L (ref 135–145)
Total Bilirubin: 0.5 mg/dL (ref 0.3–1.2)
Total Protein: 7.6 g/dL (ref 6.5–8.1)

## 2022-11-18 LAB — CBC WITH DIFFERENTIAL/PLATELET
Abs Immature Granulocytes: 0.03 10*3/uL (ref 0.00–0.07)
Basophils Absolute: 0.1 10*3/uL (ref 0.0–0.1)
Basophils Relative: 1 %
Eosinophils Absolute: 0.1 10*3/uL (ref 0.0–0.5)
Eosinophils Relative: 2 %
HCT: 18.9 % — ABNORMAL LOW (ref 36.0–46.0)
Hemoglobin: 4.8 g/dL — CL (ref 12.0–15.0)
Immature Granulocytes: 1 %
Lymphocytes Relative: 22 %
Lymphs Abs: 1.4 10*3/uL (ref 0.7–4.0)
MCH: 16.7 pg — ABNORMAL LOW (ref 26.0–34.0)
MCHC: 25.4 g/dL — ABNORMAL LOW (ref 30.0–36.0)
MCV: 65.9 fL — ABNORMAL LOW (ref 80.0–100.0)
Monocytes Absolute: 0.4 10*3/uL (ref 0.1–1.0)
Monocytes Relative: 6 %
Neutro Abs: 4.3 10*3/uL (ref 1.7–7.7)
Neutrophils Relative %: 68 %
Platelets: 217 10*3/uL (ref 150–400)
RBC: 2.87 MIL/uL — ABNORMAL LOW (ref 3.87–5.11)
RDW: 23.4 % — ABNORMAL HIGH (ref 11.5–15.5)
Smear Review: NORMAL
WBC: 6.3 10*3/uL (ref 4.0–10.5)
nRBC: 0 % (ref 0.0–0.2)

## 2022-11-18 MED ORDER — SODIUM CHLORIDE 0.9 % IV SOLN: 10.0000 mL/h | Freq: Once | INTRAVENOUS | Status: DC

## 2022-11-18 NOTE — ED Triage Notes (Signed)
Pt presents via POV with complaints of abnormal labs (hgb 4.0) from her PCP last week. Pt was told today to come to the ED for a transfusion. Hx of anemia due to heavy periods. States her last period was ~3 weeks ago and lasts ~1 week. Pt is asymptomatic at this time. Denies CP, dizziness, fatigue, hematuria, hematochezia, dark stools, SOB, bruising, nor LOC.

## 2022-11-19 ENCOUNTER — Other Ambulatory Visit: Payer: Self-pay

## 2022-11-19 LAB — CBC WITH DIFFERENTIAL/PLATELET
Abs Immature Granulocytes: 0.02 10*3/uL (ref 0.00–0.07)
Basophils Absolute: 0.1 10*3/uL (ref 0.0–0.1)
Basophils Relative: 2 %
Eosinophils Absolute: 0.1 10*3/uL (ref 0.0–0.5)
Eosinophils Relative: 3 %
HCT: 24.7 % — ABNORMAL LOW (ref 36.0–46.0)
Hemoglobin: 6.9 g/dL — ABNORMAL LOW (ref 12.0–15.0)
Immature Granulocytes: 0 %
Lymphocytes Relative: 20 %
Lymphs Abs: 1.1 10*3/uL (ref 0.7–4.0)
MCH: 19.3 pg — ABNORMAL LOW (ref 26.0–34.0)
MCHC: 27.9 g/dL — ABNORMAL LOW (ref 30.0–36.0)
MCV: 69 fL — ABNORMAL LOW (ref 80.0–100.0)
Monocytes Absolute: 0.5 10*3/uL (ref 0.1–1.0)
Monocytes Relative: 8 %
Neutro Abs: 3.8 10*3/uL (ref 1.7–7.7)
Neutrophils Relative %: 67 %
Platelets: 232 10*3/uL (ref 150–400)
RBC: 3.58 MIL/uL — ABNORMAL LOW (ref 3.87–5.11)
RDW: 23.3 % — ABNORMAL HIGH (ref 11.5–15.5)
Smear Review: NORMAL
WBC: 5.6 10*3/uL (ref 4.0–10.5)
nRBC: 0 % (ref 0.0–0.2)

## 2022-11-19 LAB — PREPARE RBC (CROSSMATCH)

## 2022-11-19 LAB — ABO/RH: ABO/RH(D): O NEG

## 2022-11-19 MED ORDER — IRON 28 MG PO TABS: 1.0000 | ORAL_TABLET | Freq: Every day | ORAL | 0 refills | Status: DC

## 2022-11-19 MED ORDER — NICOTINE 14 MG/24HR TD PT24
14.0000 mg | MEDICATED_PATCH | Freq: Once | TRANSDERMAL | Status: DC
  Administered 2022-11-19: 14 mg via TRANSDERMAL

## 2022-11-19 MED ORDER — NORGESTIMATE-ETH ESTRADIOL 0.25-35 MG-MCG PO TABS: 1.0000 | ORAL_TABLET | Freq: Every day | ORAL | 2 refills | Status: DC

## 2022-11-19 MED ORDER — SODIUM CHLORIDE 0.9 % IV BOLUS: 1000.0000 mL | Freq: Once | INTRAVENOUS | Status: DC

## 2022-11-19 NOTE — ED Provider Notes (Signed)
-----------------------------------------   9:17 AM on 11/19/2022 ----------------------------------------- Patient's hemoglobin has improved to 6.9.  Patient appears well, no shortness of breath normal pulse rate.  Discussed with the patient need to follow-up with OB/GYN for ongoing management.  I also believe is reasonable to start the patient on OCPs as she states she cannot get back in with her provider until January 22.  Discussed with patient return precautions for any weakness or shortness of breath.   Harvest Dark, MD 11/19/22 [phone]

## 2022-11-19 NOTE — Discharge Instructions (Addendum)
Take iron as prescribed.  See gynecology or your family provider as soon as possible.  Begin taking your birth control pills to help slow the amount of menstrual bleeding.  Thank you for choosing [place] for your health care today!  Please see your primary doctor this week for a follow up appointment.   Sometimes, in the early stages of certain disease courses it is difficult to detect in the emergency department evaluation -- so, it is important that you continue to monitor your symptoms and call your doctor right away or return to the emergency department if you develop any new or worsening symptoms.  Please go to the following website to schedule new (and existing) patient appointments:   [email or web address]  If you do not have a primary doctor try calling the following clinics to establish care:  If you have insurance:  [facility] [phone] [place] [address]   [facility]  [phone] [address] [place] [address]   If you do not have insurance:  [facility]  [phone] [address], [place] [address]   The following is another list of primary care offices in the area who are accepting new patients at this time.  Please reach out to one of them directly and let them know you would like to schedule an appointment to follow up on an Emergency Department visit, and/or to establish a new primary care provider (PCP).  There are likely other primary care clinics in the are who are accepting new patients, but this is an excellent place to start:  [place] physician: Dr Lavon Paganini [address] [place], [place] [address] [phone]  [facility] Lead Physician: Dr Steele Sizer [address], [place], [place] [address] [phone]  [place] Physician: Dr Park Liter [address] [place], [place] [address] [phone]  [facility] Lead Physician: Dr Dewaine Oats [place], [place], [place] [address] [phone]  [facility] Physician: Dr Halina Maidens [address] [place] Broach [place], [place] [address] [phone]   It was my pleasure to care for you today.   Ho[place] Brunette Jacelyn Grip, MD

## 2022-11-19 NOTE — ED Provider Notes (Signed)
I received this patient in signout who is markedly anemic, history of heavy menstrual periods, history of iron deficiency anemia.  No labs in the last 3 years.  Is getting 2 units of packed red blood cells and will recheck H&H at that time.  Plan will be with improvement and continued stability with no active bleeding to be discharged with follow-up as an outpatient.   Lucillie Garfinkel, MD 11/19/22 [phone]

## 2022-11-19 NOTE — ED Provider Notes (Signed)
[facility] Provider Note    Event Date/Time   First MD Initiated Contact with Patient 11/18/22 2103     (approximate)   History   Abnormal Lab   HPI  Ann Cox is a 46 y.o. female patient reports she has very heavy menstrual periods.  She said that she does not have any other bleeding.  She does not have any black stools or bloody stools or vomiting blood or anything else.  She is feeling a little bit lightheaded at times and a little bit of shortness of breath with exertion.  She went to see her PCP and had a hemoglobin of 4 she was told to come to the ER for transfusion.  She has a history of anemia but not this bad.  Her last menstrual period was 3 weeks ago.  Usually last a week.      Physical Exam   Triage Vital Signs: ED Triage Vitals  Enc Vitals Group     BP 11/18/22 1920 (!) 140/65     Pulse Rate 11/18/22 1920 92     Resp 11/18/22 1920 18     Temp 11/18/22 1920 98.5 F (36.9 C)     Temp Source 11/18/22 1920 Oral     SpO2 11/18/22 1920 100 %     Weight 11/18/22 1923 138 lb (62.6 kg)     Height 11/18/22 1923 '5\' 4"'$  (1.626 m)     Head Circumference --      Peak Flow --      Pain Score 11/18/22 1921 0     Pain Loc --      Pain Edu? --      Excl. in [place]? --     Most recent vital signs: Vitals:   11/18/22 2300 11/19/22 0014  BP: (!) 112/59 (!) 117/47  Pulse: 67 68  Resp: 17 12  Temp:  98.3 F (36.8 C)  SpO2: 99% 100%     General: Awake, no distress.  CV:  Good peripheral perfusion.  Heart regular rate and rhythm no audible murmurs lying down Resp:  Normal effort.  Lungs are clear Abd:  No distention.  Soft and nontender no organomegaly palpable Extremities: No edema   ED Results / Procedures / Treatments   Labs (all labs ordered are listed, but only abnormal results are displayed) Labs Reviewed  CBC WITH DIFFERENTIAL/PLATELET - Abnormal; Notable for the following components:      Result Value   RBC 2.87 (*)     Hemoglobin 4.8 (*)    HCT 18.9 (*)    MCV 65.9 (*)    MCH 16.7 (*)    MCHC 25.4 (*)    RDW 23.4 (*)    All other components within normal limits  COMPREHENSIVE METABOLIC PANEL - Abnormal; Notable for the following components:   Glucose, Bld 116 (*)    All other components within normal limits  CBC WITH DIFFERENTIAL/PLATELET  TYPE AND SCREEN  PREPARE RBC (CROSSMATCH)  ABO/RH     EKG     RADIOLOGY    PROCEDURES:  Critical Care performed:   Procedures   MEDICATIONS ORDERED IN ED: Medications  0.9 %  sodium chloride infusion (has no administration in time range)     IMPRESSION / MDM / ASSESSMENT AND PLAN / ED COURSE  I reviewed the triage vital signs and the nursing notes. Plan to transfuse 2 units labs wants another hemoglobin before we give any more blood.  Will try to do  this in the emergency department and discharged the patient from the emergency department after the transfusion as most of the rooms are already full of boarding admissions.  Differential diagnosis includes, but is not limited to, anemia most likely from blood loss most likely from her.  Is the main and most likely diagnosis.  Of course other diagnosis is are hemolysis or rectal bleeding or sickle cell or something similar.  Patient's presentation is most consistent with acute presentation with potential threat to life or bodily function.  The patient is on the cardiac monitor to evaluate for evidence of arrhythmia and/or significant heart rate changes.  None have been seen I am signing the patient out to the oncoming physician at this time.  FINAL CLINICAL IMPRESSION(S) / ED DIAGNOSES   Final diagnoses:  Symptomatic anemia     Rx / DC Orders   ED Discharge Orders     None        Note:  This document was prepared using Dragon voice recognition software and may include unintentional dictation errors.   Nena Polio, MD 11/19/22 [name]

## 2022-11-19 NOTE — ED Notes (Signed)
Patient ambulatory to restroom with steady gait. Denies any shortness of breath or dizziness.

## 2022-11-20 LAB — TYPE AND SCREEN
ABO/RH(D): O NEG
Antibody Screen: NEGATIVE
Unit division: 0
Unit division: 0

## 2022-11-20 LAB — BPAM RBC
Blood Product Expiration Date: 202312192359
Blood Product Expiration Date: 202312262359
ISSUE DATE / TIME: 202312120019
ISSUE DATE / TIME: 202312120404
Unit Type and Rh: 9500
Unit Type and Rh: 9500

## 2022-11-27 DIAGNOSIS — F1111 Opioid abuse, in remission: Secondary | ICD-10-CM | POA: Diagnosis not present

## 2022-11-27 DIAGNOSIS — Z7151 Drug abuse counseling and surveillance of drug abuser: Secondary | ICD-10-CM | POA: Diagnosis not present

## 2022-12-24 DIAGNOSIS — F1111 Opioid abuse, in remission: Secondary | ICD-10-CM | POA: Diagnosis not present

## 2022-12-25 DIAGNOSIS — Z7151 Drug abuse counseling and surveillance of drug abuser: Secondary | ICD-10-CM | POA: Diagnosis not present

## 2022-12-26 ENCOUNTER — Encounter: Payer: Self-pay | Admitting: Obstetrics and Gynecology

## 2022-12-26 ENCOUNTER — Ambulatory Visit: Payer: BC Managed Care – PPO | Admitting: Obstetrics and Gynecology

## 2022-12-26 ENCOUNTER — Other Ambulatory Visit (HOSPITAL_COMMUNITY)
Admission: RE | Admit: 2022-12-26 | Discharge: 2022-12-26 | Disposition: A | Discharge status: Home / Self Care | Payer: BC Managed Care – PPO | Source: Ambulatory Visit | Attending: Obstetrics and Gynecology | Admitting: Obstetrics and Gynecology

## 2022-12-26 VITALS — BP 120/60 | Ht 63.0 in | Wt 143.0 lb

## 2022-12-26 DIAGNOSIS — Z1151 Encounter for screening for human papillomavirus (HPV): Secondary | ICD-10-CM | POA: Insufficient documentation

## 2022-12-26 DIAGNOSIS — Z124 Encounter for screening for malignant neoplasm of cervix: Secondary | ICD-10-CM | POA: Diagnosis not present

## 2022-12-26 DIAGNOSIS — D259 Leiomyoma of uterus, unspecified: Secondary | ICD-10-CM | POA: Diagnosis not present

## 2022-12-26 DIAGNOSIS — Z1501 Genetic susceptibility to malignant neoplasm of breast: Secondary | ICD-10-CM | POA: Diagnosis not present

## 2022-12-26 DIAGNOSIS — N920 Excessive and frequent menstruation with regular cycle: Secondary | ICD-10-CM | POA: Diagnosis not present

## 2022-12-26 DIAGNOSIS — Z1231 Encounter for screening mammogram for malignant neoplasm of breast: Secondary | ICD-10-CM

## 2022-12-26 DIAGNOSIS — Z1509 Genetic susceptibility to other malignant neoplasm: Secondary | ICD-10-CM

## 2022-12-26 DIAGNOSIS — D219 Benign neoplasm of connective and other soft tissue, unspecified: Secondary | ICD-10-CM | POA: Insufficient documentation

## 2022-12-26 DIAGNOSIS — Z1589 Genetic susceptibility to other disease: Secondary | ICD-10-CM

## 2022-12-26 MED ORDER — NORETHINDRONE ACETATE 5 MG PO TABS: 5.0000 mg | ORAL_TABLET | Freq: Every day | ORAL | 0 refills | Status: DC

## 2022-12-26 NOTE — Progress Notes (Signed)
System, Provider Not In   Chief Complaint  Patient presents with   Referral    Heavy cycles x years    HPI:      Ann Cox is a 47 y.o. G0P0000 whose LMP was Patient's last menstrual period was 11/19/2022 (approximate)., presents today for NP eval of menorrhagia, referred by PCP. Menses are monthly, lasting 9 days (occas up to 2 months), changing overnight pads every 1-2 hrs whole cycle, with large clots, frequent BTB, mild dysmen, no meds needed. Pt with IDA and seeing hematology/got blood transfusion 12/23. Started on sprintec to help with cycle 12/23. Had spotting/bleeding daily after start till a few days ago. Pt is tob user. No hx of HTN, DVTs, migraines. Pt with hx of 11 cm leio on 11/20 GYN u/s. Having urinary retention issues due to leio.  FH ovarian cancer in her mat aunt. 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  Past due for pap.  Not getting mammos.  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   Abscess of breast 12/20/2016   ACL (anterior cruciate ligament) rupture 03/10/2014   Smoker 02/17/2014   Left knee pain 02/16/2014   History of ovarian cyst 02/16/2014   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   MyRisk testing; (increased risk of breast/pancreatic)     Past Surgical History:  Procedure Laterality Date    ARTHROSCOPY WITH ANTERIOR CRUCIATE LIGAMENT (ACL) REPAIR WITH ANTERIOR TIBILIAS GRAFT Left 03/10/2015   Procedure: LEFT KNEE ARTHROSCOPY MEDIAL MENISCECTOMY  ANTERIOR CRUCIATE LIGAMENT (ACL) TIBIAL ANTERIOR ALLOGRAFT;  Surgeon: Marchia Bond, MD;  Location: [place];  Service: Orthopedics;  Laterality: Left;   KNEE ARTHROSCOPY WITH MEDIAL MENISECTOMY Left 03/10/2015   Procedure: KNEE ARTHROSCOPY WITH MEDIAL MENISECTOMY;  Surgeon: Marchia Bond, MD;  Location: [place];  Service: Orthopedics;  Laterality: Left;    Family History  Adopted: Yes  Problem Relation Age of Onset   Alcohol abuse Maternal Uncle    Alcohol abuse Paternal Uncle    Arthritis Maternal Grandmother    Heart disease Maternal Grandmother    Anxiety disorder Maternal Grandmother    Rheum arthritis Paternal Uncle    Arthritis Mother    Hypertension Mother    Anxiety disorder Mother    Arthritis Father    Hyperlipidemia Father    Hypertension Father    Uterine cancer Maternal Aunt    Hyperlipidemia Maternal Grandfather    Breast cancer Maternal Grandfather    Anxiety disorder Maternal Aunt    Ovarian cancer Maternal Aunt     Social History   Socioeconomic History   Marital status: Single    Spouse name: Not on file   Number of children: Not on file   Years of education: Not on file  Highest education level: Not on file  Occupational History   Not on file  Tobacco Use   Smoking status: Every Day    Packs/day: 0.50    Years: 14.00    Total pack years: 7.00    Types: Cigarettes   Smokeless tobacco: Never  Vaping Use   Vaping Use: Never used  Substance and Sexual Activity   Alcohol use: Yes    Comment: occasionally   Drug use: No   Sexual activity: Yes    Birth control/protection: None  Other Topics Concern   Not on file  Social History Narrative   Not on file   Social Determinants of Health   Financial Resource Strain: Not on file  Food Insecurity: Not on file   Transportation Needs: Not on file  Physical Activity: Not on file  Stress: Not on file  Social Connections: Not on file  Intimate Partner Violence: Not on file    Outpatient Medications Prior to Visit  Medication Sig Dispense Refill   ZUBSOLV 2.9-0.71 MG SUBL      norgestimate-ethinyl estradiol (White Signal 28) 0.25-35 MG-MCG tablet Take 1 tablet by mouth daily. 28 tablet 2   Ferrous Sulfate (IRON) 28 MG TABS Take 1 tablet (28 mg total) by mouth daily. 30 tablet 0   lactulose (CHRONULAC) 10 GM/15ML solution Take 30 mLs (20 g total) by mouth daily as needed for mild constipation. 120 mL 0   No facility-administered medications prior to visit.      ROS:  Review of Systems  Constitutional:  Negative for fever.  Gastrointestinal:  Negative for blood in stool, constipation, diarrhea, nausea and vomiting.  Genitourinary:  Positive for difficulty urinating and menstrual problem. Negative for dyspareunia, dysuria, flank pain, frequency, hematuria, urgency, vaginal bleeding, vaginal discharge and vaginal pain.  Musculoskeletal:  Negative for back pain.  Skin:  Negative for rash.   BREAST: No symptoms   OBJECTIVE:   Vitals:  BP 120/60   Ht '5\' 3"'$  (1.6 m)   Wt 143 lb (64.9 kg)   LMP 11/19/2022 (Approximate)   BMI 25.33 kg/m   Physical Exam Vitals reviewed.  Constitutional:      Appearance: She is well-developed.  Pulmonary:     Effort: Pulmonary effort is normal.  Abdominal:       Comments: LARGE, POST LEIO  Genitourinary:    General: Normal vulva.     Pubic Area: No rash.      Labia:        Right: No rash, tenderness or lesion.        Left: No rash, tenderness or lesion.      Vagina: Normal. No vaginal discharge, erythema or tenderness.     Cervix: Normal.     Uterus: Normal. Enlarged and fixed. Not tender.      Adnexa: Right adnexa normal and left adnexa normal.       Right: No mass or tenderness.         Left: No mass or tenderness.    Musculoskeletal:         General: Normal range of motion.     Cervical back: Normal range of motion.  Skin:    General: Skin is warm and dry.  Neurological:     General: No focal deficit present.     Mental Status: She is alert and oriented to person, place, and time.  Psychiatric:        Mood and Affect: Mood normal.        Behavior: Behavior  normal.        Thought Content: Thought content normal.        Judgment: Judgment normal.     Assessment/Plan: Menorrhagia with regular cycle - Plan: norethindrone (AYGESTIN) 5 MG tablet, US PELVIC COMPLETE WITH TRANSVAGINAL; pt getting iron transfusion, has large leio, wants hyst. Check GYN u/s, refer to MD after u/s for mgmt. Rx aygestin to stop bleeding in meantime (pt is smoker). F/u prn.   Leiomyoma - Plan: US PELVIC COMPLETE WITH TRANSVAGINAL; check GYN u/s, Discussed UAE/hyst   Cervical cancer screening - Plan: Cytology - PAP  Screening for HPV (human papillomavirus) - Plan: Cytology - PAP  Monoallelic mutation of ATM gene - Plan: MM 3D SCREEN BREAST BILATERAL; pt with increased risk of breast cancer. Start SBE, schedule mammo, discussed scr breast MRI recommendations. Pt to call for ref if desires. No FH pancreatic cancer.   Encounter for screening mammogram for malignant neoplasm of breast - Plan: MM 3D SCREEN BREAST BILATERAL    Meds ordered this encounter  Medications   norethindrone (AYGESTIN) 5 MG tablet    Sig: Take 1 tablet (5 mg total) by mouth daily.    Dispense:  90 tablet    Refill:  0    Order Specific Question:   Supervising Provider    Answer:   Renaldo Reel      Return for with Dr. Marcelline Mates after GYN u/s for hyst/leio consult.  Kaitrin Seybold B. Oluwademilade Kellett, PA-C 12/26/2022 5:27 PM

## 2022-12-26 NOTE — Patient Instructions (Signed)
I value your feedback and you entrusting us with your care. If you get a [facility] patient survey, I would appreciate you taking the time to let us know about your experience today. Thank you! ? ? ?

## 2023-01-01 ENCOUNTER — Telehealth: Payer: Self-pay

## 2023-01-02 DIAGNOSIS — D649 Anemia, unspecified: Secondary | ICD-10-CM | POA: Diagnosis not present

## 2023-01-02 DIAGNOSIS — R109 Unspecified abdominal pain: Secondary | ICD-10-CM | POA: Diagnosis not present

## 2023-01-02 DIAGNOSIS — N1330 Unspecified hydronephrosis: Secondary | ICD-10-CM | POA: Diagnosis not present

## 2023-01-02 DIAGNOSIS — R1084 Generalized abdominal pain: Secondary | ICD-10-CM | POA: Diagnosis not present

## 2023-01-02 DIAGNOSIS — N920 Excessive and frequent menstruation with regular cycle: Secondary | ICD-10-CM | POA: Diagnosis not present

## 2023-01-02 DIAGNOSIS — N83201 Unspecified ovarian cyst, right side: Secondary | ICD-10-CM | POA: Diagnosis not present

## 2023-01-02 DIAGNOSIS — F17210 Nicotine dependence, cigarettes, uncomplicated: Secondary | ICD-10-CM | POA: Diagnosis not present

## 2023-01-02 DIAGNOSIS — D529 Folate deficiency anemia, unspecified: Secondary | ICD-10-CM | POA: Diagnosis not present

## 2023-01-02 DIAGNOSIS — R1031 Right lower quadrant pain: Secondary | ICD-10-CM | POA: Diagnosis not present

## 2023-01-02 DIAGNOSIS — Z793 Long term (current) use of hormonal contraceptives: Secondary | ICD-10-CM | POA: Diagnosis not present

## 2023-01-02 DIAGNOSIS — D259 Leiomyoma of uterus, unspecified: Secondary | ICD-10-CM | POA: Diagnosis not present

## 2023-01-02 DIAGNOSIS — D251 Intramural leiomyoma of uterus: Secondary | ICD-10-CM | POA: Diagnosis not present

## 2023-01-02 DIAGNOSIS — N83202 Unspecified ovarian cyst, left side: Secondary | ICD-10-CM | POA: Diagnosis not present

## 2023-01-02 LAB — CYTOLOGY - PAP
Comment: NEGATIVE
Diagnosis: NEGATIVE
High risk HPV: NEGATIVE

## 2023-01-08 NOTE — Progress Notes (Unsigned)
GYNECOLOGY PROGRESS NOTE  Subjective:    Patient ID: Ann Cox, female    DOB: 1976/03/24, 47 y.o.   MRN: [id number]  HPI  Patient is a 47 y.o. [id number] female who presents for follow up from the Emergency Room.  She was evaluated at [facility] ED on 01/02/2023. She presented at ED with RLQ pain which started 2 nights prior and was on the floor due to pain. Patient reports pain was constant, sharp, and radiating to her lower back. Reports staying still helps with the pain and walking is making it worse. Her pain is 7/10 while lying down and with walking its 8-9/10.  Denies any nausea, vomiting. Denies any fevers. She thought it was gas pain. Reports taking Tylenol, Gas-X , and Colace with no relief (was recommended by triage nurse at OB/GYN office) , States she had normal bowel movement yesterday.   She has a history of fibroid uterus and also has hx of menorrhagia all through her life. 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 Her most recent Hgb was 6.9 on 11/19/2022. Currently denies symptoms today.  Was previously on OCPs, but recently was changed to Aygestin for better management of her bleeding. Is experiencing pelvic pain and pressure.  Last ultrasound noted an 11 cm fibroid. Patient states that she is ready to consider definitive surgical management with hysterectomy at this time. Would also like to consider removal of her ovaries due to family history, genetic screening, and history of ovarian cysts in the past.     Menstrual History: OB History     Gravida  1   Para      Term      Preterm      AB  1   Living         SAB  1   IAB      Ectopic      Multiple      Live Births           Obstetric Comments  1st Menstrual Cycle: 29          Menarche age: 56 Patient's last menstrual period was 12/15/2021 (approximate). Period Cycle (Days): 28 Period Duration (Days): 1-30 Period Pattern: Regular Menstrual Flow: Heavy Menstrual Control: Maxi pad Menstrual Control Change Freq (Hours): 1-2 Dysmenorrhea: (!) Moderate Dysmenorrhea Symptoms: Cramping, Other (Comment)   GYN History:  Patient's last menstrual period was 12/15/2021 (approximate). Last pap smear: 12/26/2022.  Results were: normal.  Last mammogram: 12/17/2016. Diagnostic, results were normal.    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   MyRisk testing; (increased risk of breast/pancreatic)    Family History  Adopted: Yes  Problem Relation Age of Onset   Alcohol abuse Maternal Uncle    Alcohol abuse Paternal Uncle    Arthritis Maternal Grandmother    Heart disease Maternal Grandmother    Anxiety disorder Maternal Grandmother  Rheum arthritis Paternal Uncle    Arthritis Mother    Hypertension Mother    Anxiety disorder Mother    Arthritis Father    Hyperlipidemia Father    Hypertension Father    Uterine cancer Maternal Aunt    Hyperlipidemia Maternal Grandfather    Breast cancer Maternal Grandfather    Anxiety disorder Maternal Aunt    Ovarian cancer Maternal Aunt     Past Surgical History:  Procedure Laterality Date   ARTHROSCOPY WITH ANTERIOR CRUCIATE LIGAMENT (ACL) REPAIR WITH ANTERIOR TIBILIAS GRAFT Left 03/10/2015   Procedure: LEFT KNEE ARTHROSCOPY MEDIAL MENISCECTOMY  ANTERIOR CRUCIATE LIGAMENT (ACL) TIBIAL ANTERIOR ALLOGRAFT;  Surgeon: Marchia Bond, MD;  Location: [place];  Service:  Orthopedics;  Laterality: Left;   KNEE ARTHROSCOPY WITH MEDIAL MENISECTOMY Left 03/10/2015   Procedure: KNEE ARTHROSCOPY WITH MEDIAL MENISECTOMY;  Surgeon: Marchia Bond, MD;  Location: [place];  Service: Orthopedics;  Laterality: Left;    Current Outpatient Medications on File Prior to Visit  Medication Sig Dispense Refill   norethindrone (AYGESTIN) 5 MG tablet Take 1 tablet (5 mg total) by mouth daily. 90 tablet 0   ZUBSOLV 2.9-0.71 MG SUBL      No current facility-administered medications on file prior to visit.    No Known Allergies    Review of Systems Pertinent items are noted in HPI.   Objective:   Blood pressure 136/60, pulse 87, resp. rate 16, height '5\' 3"'$  (1.6 m), weight 141 lb 14.4 oz (64.4 kg), last menstrual period 12/15/2021. Body mass index is 25.14 kg/m. General appearance: alert and no distress Abdomen: normal findings: bowel sounds normal and soft, non-tender and abnormal findings:  mass, located in the lower abdomen (palpable up to 3-4 cm above pubic symphysis), mobile, non-tender.  Pelvic: external genitalia normal, rectovaginal septum normal.  Vagina without discharge.  Cervix normal appearing, slightly displaced anteriorly and to the left. no lesions and no motion tenderness.  Uterus mobile, nontender, enlarged, irregular shape, ~ 14-16 week sized. .  Adnexae non-palpable, nontender bilaterally.  Extremities: extremities normal, atraumatic, no cyanosis or edema Neurologic: Grossly normal   Labs:  Lab Results  Component Value Date   WBC 5.6 11/19/2022   HGB 6.9 (L) 11/19/2022   HCT 24.7 (L) 11/19/2022   MCV 69.0 (L) 11/19/2022   PLT 232 11/19/2022    No results found for: "TSH"   Imaging:  US PELVIC COMPLETE W TRANSVAGINAL AND TORSION R/O CLINICAL DATA:  Indeterminate pelvic mass on CT.  EXAM: TRANSABDOMINAL AND TRANSVAGINAL ULTRASOUND OF PELVIS  DOPPLER ULTRASOUND OF OVARIES  TECHNIQUE: Both transabdominal and transvaginal  ultrasound examinations of the pelvis were performed. Transabdominal technique was performed for global imaging of the pelvis including uterus, ovaries, adnexal regions, and pelvic cul-de-sac.  It was necessary to proceed with endovaginal exam following the transabdominal exam to visualize the uterus. Color and duplex Doppler ultrasound was utilized to evaluate blood flow to the ovaries.  COMPARISON:  CT 11/04/2019  FINDINGS: Uterus  Measurements: 6.2 x 2.9 by 4.1 cm = volume: 49 mL. Uterus volume measure excludes the large leiomyoma. There is a large well-circumscribed mixed echogenicity mass in the uterine body measuring 11.1 x 8.8 x 11.0 cm. Mass extends along the RIGHT uterine wall.  Endometrium  Thickness: 14 mm.  Normal thickness for premenopausal female  Right ovary  Measurements: 4.2 x 2.4 x 2.7 cm = volume: 14.2 mL. Dominant follicle in the RIGHT ovary. Two small cystic regions adjacent to the RIGHT  ovary appears benign.  Left ovary  Measurements: Not identified = volume: mL. Normal appearance/no adnexal mass.  Pulsed Doppler evaluation of of the RIGHT ovary demonstrates normal low-resistance arterial and venous waveforms.  Other findings  No free fluid  IMPRESSION: 1. Large well-circumscribed leiomyoma involving the uterine fundus and body along the RIGHT side of the uterus. 2. Normal endometrium. 3. Normal RIGHT ovary. LEFT ovary not identified. LEFT ovary identified on comparison CT appears normal.  Electronically Signed   By: Suzy Bouchard M.D.   On: 11/04/2019 07:45 CT Renal Stone Study CLINICAL DATA:  Urinary retention  EXAM: CT ABDOMEN AND PELVIS WITHOUT CONTRAST  TECHNIQUE: Multidetector CT imaging of the abdomen and pelvis was performed following the standard protocol without IV contrast.  COMPARISON:  None.  FINDINGS: LOWER CHEST: No basilar pleural or apical pericardial effusion.  HEPATOBILIARY: Normal hepatic contours. There  is no intra- or extrahepatic biliary dilatation. The gallbladder is normal.  PANCREAS: Normal pancreatic contours without pancreatic ductal dilatation or peripancreatic fluid collection.  SPLEEN: Normal.  ADRENALS/URINARY TRACT:  --Adrenal glands: Normal.  --Right kidney/ureter: No hydronephrosis, nephroureterolithiasis or solid renal mass.  --Left kidney/ureter: No hydronephrosis, nephroureterolithiasis or solid renal mass.  --Urinary bladder: Decompressed by Foley catheter.  STOMACH/BOWEL:  --Stomach/Duodenum: There is no hiatal hernia. The duodenal course and caliber are normal.  --Small bowel: No dilatation or inflammation.  --Colon: No focal abnormality.  --Appendix: Normal.  VASCULAR/LYMPHATIC: Normal course and caliber of the major abdominal vessels. No abdominal or pelvic lymphadenopathy.  REPRODUCTIVE: There is an intermediate attenuation mass of the midline pelvis that measures 12.0 x 11.3 x 10.8 cm. This exerts marked mass effect on the urinary bladder anteriorly.  MUSCULOSKELETAL. No bony spinal canal stenosis or focal osseous abnormality.  OTHER: None.  IMPRESSION: Large pelvic mass causing mass effect on the urinary bladder and possibly causing bladder outlet obstruction. The mass is most likely a large uterine fibroid. Correlation with pelvic ultrasound might be helpful.  Electronically Signed   By: Ulyses Jarred M.D.   On: 11/04/2019 05:09   Assessment:   1. Menorrhagia with regular cycle   2. Leiomyoma   3. Iron deficiency anemia due to chronic blood loss   4. History of ovarian cyst   5. Family history of ovarian cancer   6. Monoallelic mutation of ATM gene      Plan:   1. Menorrhagia with regular cycle - Currently utilizing Aygestin for management. Notes lifelong history of heavy cycles.  - Discussed need for endometrial biopsy for completion of workup (See procedure note below).  - Discussed all management options for abnormal  uterine bleeding and fibroids, including, tranexamic acid (Lysteda), oral progesterone, Depo Provera, Levonogestrel IUD, GnRH agonists (Myefembree or Oriahnnn), uterine fibroid embolization, hysterectomy as definitive surgical management.  Discussed risks and benefits of each method.   Patient desires definitive surgical management.  Printed patient education handouts were given to the patient to review at home.  Continue Aygestin prescribed as needed for now,  bleeding precautions reviewed.   2. Leiomyoma - Enlarged fibroid uterus. See above regarding discussion of management options. Patient desires definitive management with hysterectomy. Based on size and location of fibroid, discussed robotic laparoscopic hysterectomy vs abdominal hysterectomy. Will discuss further at pre-op appointment. Surgery tentatively scheduled for 02/10/23.   3. Iron deficiency anemia due to chronic blood loss - Recently s/p 2 encounters for blood transfusions.  Most recent Hgb last month was 6.9.   - Recommend iron infusions as patient is asymptomatic  to prepare for surgery.   4. History of ovarian cyst - Patient desires removal of ovaries at time of hysterectomy due to family history and personal history of ovarian cysts. Discussed that this would place patient in surgical menopause, and would recommend hormonal replacement therapy until approximately age 47 or 8.   71. Family history of ovarian cancer - see above note, desiring removal of ovaries at time of surgery.    6. Monoallelic mutation of ATM gene - Patient at higher risk for breast and ovarian cancer. Has not had a mammogram since 2018. Will order.   RTC in 3 weeks for pre-op appointment.     Endometrial Biopsy Procedure Note  The patient is positioned on the exam table in the dorsal lithotomy position. Bimanual exam confirms uterine position and size. A Graves speculum is placed into the vagina. A single toothed tenaculum is placed onto the anterior lip  of the cervix. The pipette is placed into the endocervical canal and is advanced to the uterine fundus. Using a piston like technique, with vacuum created by withdrawing the stylus, the endometrial specimen is obtained and transferred to the biopsy container. Minimal bleeding is encountered. The procedure is well tolerated.   Uterine Position: anterior    Uterine Length: 6 cm   Uterine Specimen: Scant   Post procedure instructions are given. The patient is scheduled for follow up appointment.    A total of 40 minutes were spent during this encounter, including review of previous progress notes, recent imaging and labs, face-to-face with time with patient involving counseling and coordination of care, as well as documentation for current visit.  Rubie Maid, MD [place]

## 2023-01-10 ENCOUNTER — Other Ambulatory Visit (HOSPITAL_COMMUNITY)
Admission: RE | Admit: 2023-01-10 | Discharge: 2023-01-10 | Disposition: A | Discharge status: Home / Self Care | Payer: BC Managed Care – PPO | Source: Ambulatory Visit | Attending: Obstetrics and Gynecology | Admitting: Obstetrics and Gynecology

## 2023-01-10 ENCOUNTER — Encounter: Payer: Self-pay | Admitting: Obstetrics and Gynecology

## 2023-01-10 ENCOUNTER — Ambulatory Visit: Payer: BC Managed Care – PPO | Admitting: Obstetrics and Gynecology

## 2023-01-10 ENCOUNTER — Ambulatory Visit: Payer: BC Managed Care – PPO

## 2023-01-10 VITALS — BP 136/60 | HR 87 | Resp 16 | Ht 63.0 in | Wt 141.9 lb

## 2023-01-10 DIAGNOSIS — Z8041 Family history of malignant neoplasm of ovary: Secondary | ICD-10-CM

## 2023-01-10 DIAGNOSIS — Z1501 Genetic susceptibility to malignant neoplasm of breast: Secondary | ICD-10-CM

## 2023-01-10 DIAGNOSIS — D500 Iron deficiency anemia secondary to blood loss (chronic): Secondary | ICD-10-CM

## 2023-01-10 DIAGNOSIS — N920 Excessive and frequent menstruation with regular cycle: Secondary | ICD-10-CM

## 2023-01-10 DIAGNOSIS — Z8742 Personal history of other diseases of the female genital tract: Secondary | ICD-10-CM

## 2023-01-10 DIAGNOSIS — N858 Other specified noninflammatory disorders of uterus: Secondary | ICD-10-CM | POA: Diagnosis not present

## 2023-01-10 DIAGNOSIS — D259 Leiomyoma of uterus, unspecified: Secondary | ICD-10-CM

## 2023-01-10 DIAGNOSIS — Z1231 Encounter for screening mammogram for malignant neoplasm of breast: Secondary | ICD-10-CM

## 2023-01-10 DIAGNOSIS — D219 Benign neoplasm of connective and other soft tissue, unspecified: Secondary | ICD-10-CM

## 2023-01-10 NOTE — Patient Instructions (Addendum)
GYNECOLOGY PRE-OPERATIVE INSTRUCTIONS  You are scheduled for surgery on 02/10/2023.  The name of your procedure is: Robotic Total Hysterectomy with Bilateral Salpingo-oophorectomy (removal of uterus, tubes and ovaries).   Please read through these instructions carefully regarding preparation for your surgery: Nothing to eat after midnight on the day prior to surgery.  Do not take any medications unless recommended by your provider on day prior to surgery.  Do not take NSAIDs (Motrin, Aleve) or aspirin 7 days prior to surgery.  You may take Tylenol products for minor aches and pains.  You will receive a prescription for pain medications post-operatively.  You will be contacted by phone approximately 1-2 weeks prior to surgery to schedule your pre-operative appointment.  If you are being admitted to the hospital for an overnight stay, you will require COVID testing prior to surgery. Instructions will be given to you on when and where to have this performed.  Please call the office if you have any questions regarding your upcoming surgery.    Thank you for choosing [facility] at [facility].    Robotic Assisted Hysterectomy  This surgical method provides a high-powered 3-D view of the operating area, permits an extensive range of motion that is more precise the human hand and allows use of surgical instruments from angles and positions that would be difficult to otherwise achieve.  OVERVIEW What is robotic assisted hysterectomy? Robotic assisted hysterectomy is a type of surgery that uses surgeon-controlled robotic equipment to remove your uterus.  Hysterectomy is the surgical removal of the uterus. The uterus is a hollow, muscular organ located in a woman's pelvis. Having a hysterectomy ends menstruation and the ability to become pregnant. Depending on the reason for the surgery, a hysterectomy may also involve the removal of other organs and tissues, such as the ovaries and/or fallopian  tubes.  A robotic assisted surgery system consists of two separate pieces of equipment. One robotic piece of equipment is located next to the patient in the operating room. This robotic piece has four arms, which are long thin tubes that are attached to either a thin surgical instrument or a tiny camera. The surgical instruments and camera enter the patient's body through small  inch cuts (incisions) in the abdomen.  A short distance away from the operating table, the surgeon is seated in front of a separate computerized piece of equipment that looks like a video game. The surgeon controls the movements of the robotic arms and instruments with hand-held controls. The surgeon looks through binocular-like lenses on the equipment and a computer generates a three-dimensional view of the operating area. Foot pedals control the camera and allow the surgeon to zoom in or out to change the surgical view.  In what ways does robotic assisted surgery help the surgeon? The robotic assisted surgery is a computer-enhanced surgical system that gives surgeons the advantages of:  A 3-D view of the surgical field, including depth, up to 15 times the magnification and high resolution Instruments that mimic the movement of the human hands, wrists and fingers, allowing an extensive range of motion that is more precise than the surgeon's natural hand and wrist movements A constant steadiness of the robot arms and instruments and robot wrists that make it easier for surgeons to operate on organs and tissues for long periods of time and from angles and positions they would have difficulty reaching with human hands and fingers The surgeon controls every precise movement of the robotic arms and instruments. The robotic arms  cannot move on their own.  Who may benefit from robotic-assisted hysterectomy? Robotic assisted hysterectomy may be especially helpful in:  Patients who are obese Patients who have endometrial  cancer Patients who have complex surgical cases, such as advanced stage endometriosis or pelvic adhesive disease (scar tissue that binds nearby organs together) You and your surgeon will discuss if robotic assisted surgery is possible and appropriate for your specific condition.  PROCEDURE DETAILS To perform robotic-assisted surgery, a surgeon is seated in front of a computerized console that provides a high-powered view of the operating area. Movement of robotic arms attached to surgical instruments is controlled by the surgeon seated at the console a few feet away. Typical operating room setup for robotic assisted surgery. The surgeon is seated seated in front of a computerized console that provides a high-powered, 3-D view of the operating area. The surgical instruments, attached to robotic arms, are controlled by the surgeon seated at the console. What happens before and during robotic assisted hysterectomy? Before the procedure  Before your surgery, your doctor will perform a physical exam, order blood and urine tests and may order other tests to check your general health. Your doctor will tell you which of your current medications can continue to be taken and which will need to be temporarily stopped before surgery. You will be given instructions on when to stop eating and drinking the evening before and morning of your surgery. Your surgeon will explain the procedure in detail, including possible complications and side effects. He or she will also answer your questions.  On the day of surgery:  A urinary catheter may be inserted to empty your bladder Your abdominal area will be cleaned with a sterile solution An intravenous (IV) line will be placed in a vein in your arm to deliver medications and fluids During the procedure  After receiving anesthesia, your surgeon will make four or five small surgical cuts (incisions) in your abdomen (belly). The thin surgical instruments and tiny lighted  camera attached to the arms of the surgical robot are inserted into the abdomen through these incisions.  The surgeon controls the precise movement of the robotic arms, surgical instruments and camera while seated at a computer console. Members of the surgical team stand next to the operating table to change the robotic instruments and provide other assistance to the surgeon as needed.  Your surgeon typically removes the uterus through the vagina, like when delivering a baby. In certain cases, the uterus is removed through the small incisions in your abdomen.  An anesthesiologist monitors your anesthesia and vital signs throughout your operation.  How long does robotic assisted hysterectomy take to complete? Robotic assisted hysterectomy typically takes between one to four hours to complete, depending upon the surgeon and the complexity of the case.  What's the typical recovery time with robotic assisted hysterectomy? Robotic hysterectomy is an outpatient procedure. You may stay in the hospital overnight but some woman can be released the same day of surgery. You will able return to light regular activities the next day (walking, eating, walking up stairs). You can drive in about a week or less at the discretion of your physician and return to exercising in about four to six weeks. Your doctor will review your progress and tell you when you can return to your normal activities.  RISKS / BENEFITS What are the advantages of robot-assisted surgery for patients compared with traditional open surgery? Compared with traditional open surgery, the benefits of robotic assisted surgery may  include:  Less blood loss during surgery Smaller incisions with less scarring. Surgery is performed through small incisions instead of the large incision of open surgery Less post-op pain Decreased risk of infection Shorter hospital stay Shorter recovery time and quicker return to previous activities. You can usually  resume normal activities as soon as you feel up to it. What are the risks of robotic hysterectomy? Robot-assisted hysterectomy takes more time compared to other hysterectomy methods, such as traditional open hysterectomy performed by a surgeon. Longer surgeries may increase your risk for complications.  Like any surgical procedure, robotic hysterectomy carries risks including:  Bleeding Damage to the bladder and other nearby organs Infection Reaction to anesthesia Blood clots that form in the legs and can travel to your lungs  RECOVERY AND OUTLOOK What is the prognosis (outlook) for people who have robotic hysterectomy? Most women recover from robotic hysterectomy in less time and with less pain compared to traditional, open hysterectomies. Because the incisions are small, people can return to their daily activities more quickly. With the exception of hysterectomy for cervical cancer, outcomes with robotic surgery are as good as open surgery with shorter recovery. Robotic surgery is NOT recommended for hysterectomies done for cervical cancer as cancer-related outcomes are significantly worse.

## 2023-01-11 ENCOUNTER — Encounter: Payer: Self-pay | Admitting: Obstetrics and Gynecology

## 2023-01-11 ENCOUNTER — Other Ambulatory Visit: Payer: Self-pay | Admitting: Obstetrics and Gynecology

## 2023-01-11 DIAGNOSIS — Z01818 Encounter for other preprocedural examination: Secondary | ICD-10-CM

## 2023-01-14 ENCOUNTER — Other Ambulatory Visit: Payer: Self-pay | Admitting: *Deleted

## 2023-01-14 ENCOUNTER — Encounter: Payer: Self-pay | Admitting: Obstetrics and Gynecology

## 2023-01-14 ENCOUNTER — Ambulatory Visit: Payer: BC Managed Care – PPO | Admitting: Obstetrics and Gynecology

## 2023-01-14 ENCOUNTER — Other Ambulatory Visit: Payer: BC Managed Care – PPO

## 2023-01-14 ENCOUNTER — Other Ambulatory Visit: Payer: Self-pay | Admitting: Obstetrics and Gynecology

## 2023-01-14 DIAGNOSIS — D500 Iron deficiency anemia secondary to blood loss (chronic): Secondary | ICD-10-CM

## 2023-01-14 LAB — SURGICAL PATHOLOGY

## 2023-01-16 ENCOUNTER — Encounter: Payer: Self-pay | Admitting: Oncology

## 2023-01-17 ENCOUNTER — Ambulatory Visit
Admission: RE | Admit: 2023-01-17 | Discharge: 2023-01-17 | Disposition: A | Discharge status: Home / Self Care | Payer: BC Managed Care – PPO | Source: Ambulatory Visit | Attending: Obstetrics and Gynecology | Admitting: Obstetrics and Gynecology

## 2023-01-17 DIAGNOSIS — D500 Iron deficiency anemia secondary to blood loss (chronic): Secondary | ICD-10-CM | POA: Diagnosis not present

## 2023-01-17 MED ORDER — SODIUM CHLORIDE 0.9 % IV SOLN
250.0000 mg | INTRAVENOUS | Status: DC
  Administered 2023-01-17: 250 mg via INTRAVENOUS
  Filled 2023-01-17: qty 20

## 2023-01-22 DIAGNOSIS — Z7151 Drug abuse counseling and surveillance of drug abuser: Secondary | ICD-10-CM | POA: Diagnosis not present

## 2023-01-22 DIAGNOSIS — Z79891 Long term (current) use of opiate analgesic: Secondary | ICD-10-CM | POA: Diagnosis not present

## 2023-01-24 ENCOUNTER — Ambulatory Visit
Admission: RE | Admit: 2023-01-24 | Discharge: 2023-01-24 | Disposition: A | Discharge status: Home / Self Care | Payer: BC Managed Care – PPO | Source: Ambulatory Visit | Attending: Obstetrics and Gynecology | Admitting: Obstetrics and Gynecology

## 2023-01-24 DIAGNOSIS — D500 Iron deficiency anemia secondary to blood loss (chronic): Secondary | ICD-10-CM | POA: Insufficient documentation

## 2023-01-24 MED ORDER — SODIUM CHLORIDE 0.9 % IV SOLN
250.0000 mg | INTRAVENOUS | Status: DC
  Administered 2023-01-24: 250 mg via INTRAVENOUS
  Filled 2023-01-24: qty 20

## 2023-01-31 ENCOUNTER — Ambulatory Visit
Admission: RE | Admit: 2023-01-31 | Discharge: 2023-01-31 | Disposition: A | Discharge status: Home / Self Care | Payer: BC Managed Care – PPO | Source: Ambulatory Visit | Attending: Obstetrics and Gynecology | Admitting: Obstetrics and Gynecology

## 2023-01-31 ENCOUNTER — Encounter: Payer: BC Managed Care – PPO | Admitting: Obstetrics and Gynecology

## 2023-01-31 DIAGNOSIS — D500 Iron deficiency anemia secondary to blood loss (chronic): Secondary | ICD-10-CM | POA: Diagnosis not present

## 2023-01-31 MED ORDER — SODIUM CHLORIDE 0.9 % IV SOLN
250.0000 mg | INTRAVENOUS | Status: DC
  Administered 2023-01-31: 250 mg via INTRAVENOUS
  Filled 2023-01-31: qty 250

## 2023-02-04 ENCOUNTER — Encounter
Admission: RE | Admit: 2023-02-04 | Discharge: 2023-02-04 | Disposition: A | Discharge status: Home / Self Care | Payer: BC Managed Care – PPO | Source: Ambulatory Visit | Attending: Obstetrics and Gynecology | Admitting: Obstetrics and Gynecology

## 2023-02-04 ENCOUNTER — Ambulatory Visit (INDEPENDENT_AMBULATORY_CARE_PROVIDER_SITE_OTHER): Payer: BC Managed Care – PPO | Admitting: Obstetrics and Gynecology

## 2023-02-04 ENCOUNTER — Other Ambulatory Visit: Payer: Self-pay

## 2023-02-04 ENCOUNTER — Encounter: Payer: Self-pay | Admitting: Obstetrics and Gynecology

## 2023-02-04 VITALS — Ht 63.0 in | Wt 142.0 lb

## 2023-02-04 VITALS — BP 146/70 | HR 81 | Resp 16 | Ht 63.0 in | Wt 142.1 lb

## 2023-02-04 DIAGNOSIS — Z1509 Genetic susceptibility to other malignant neoplasm: Secondary | ICD-10-CM

## 2023-02-04 DIAGNOSIS — Z1589 Genetic susceptibility to other disease: Secondary | ICD-10-CM

## 2023-02-04 DIAGNOSIS — D219 Benign neoplasm of connective and other soft tissue, unspecified: Secondary | ICD-10-CM

## 2023-02-04 DIAGNOSIS — Z8041 Family history of malignant neoplasm of ovary: Secondary | ICD-10-CM

## 2023-02-04 DIAGNOSIS — Z1501 Genetic susceptibility to malignant neoplasm of breast: Secondary | ICD-10-CM

## 2023-02-04 DIAGNOSIS — N920 Excessive and frequent menstruation with regular cycle: Secondary | ICD-10-CM

## 2023-02-04 DIAGNOSIS — Z8742 Personal history of other diseases of the female genital tract: Secondary | ICD-10-CM

## 2023-02-04 DIAGNOSIS — D500 Iron deficiency anemia secondary to blood loss (chronic): Secondary | ICD-10-CM

## 2023-02-04 DIAGNOSIS — Z01818 Encounter for other preprocedural examination: Secondary | ICD-10-CM

## 2023-02-04 HISTORY — DX: Unspecified ovarian cyst, unspecified side: N83.209

## 2023-02-04 HISTORY — DX: Iron deficiency anemia, unspecified: D50.9

## 2023-02-04 HISTORY — DX: Excessive and frequent menstruation with regular cycle: N92.0

## 2023-02-04 NOTE — Progress Notes (Signed)
GYNECOLOGY PREOPERATIVE HISTORY AND PHYSICAL   Subjective:  Ann Cox is a 47 y.o. G1P0010 here for surgical management of  XI ROBOTIC ASSISTED TOTAL LAPAROSCOPIC HYSTERECTOMY WITH BILATERAL SALPINGO OOPHORECTOMY.   Indications for procedure include: Menorrhagia with regular cycle and leiomyoma.  She has a history of fibroid uterus and also has hx of menorrhagia all through her life. 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 Her most recent Hgb was 6.9 on 11/19/2022. Recently had iron infusion last week. Currently denies symptoms today.  Was previously on OCPs, but recently was changed to Aygestin for better management of her bleeding. Is experiencing pelvic pain and pressure.  Last ultrasound noted an 11 cm fibroid.    Proposed surgery: XI ROBOTIC ASSISTED TOTAL LAPAROSCOPIC HYSTERECTOMY WITH BILATERAL SALPINGO OOPHORECTOMY    Pertinent Gynecological History: Patient's last menstrual period was 12/15/2021 (approximate). 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   MyRisk testing; (increased risk of breast/pancreatic)   Ovarian cyst     Past Surgical History:  Procedure Laterality Date   ARTHROSCOPY WITH ANTERIOR CRUCIATE LIGAMENT (ACL) REPAIR  WITH ANTERIOR TIBILIAS GRAFT Left 03/10/2015   Procedure: LEFT KNEE ARTHROSCOPY MEDIAL MENISCECTOMY  ANTERIOR CRUCIATE LIGAMENT (ACL) TIBIAL ANTERIOR ALLOGRAFT;  Surgeon: Marchia Bond, MD;  Location: [place];  Service: Orthopedics;  Laterality: Left;   KNEE ARTHROSCOPY WITH MEDIAL MENISECTOMY Left 03/10/2015   Procedure: KNEE ARTHROSCOPY WITH MEDIAL MENISECTOMY;  Surgeon: Marchia Bond, MD;  Location: [place];  Service: Orthopedics;  Laterality: Left;    OB History  Gravida Para Term Preterm AB Living  1       1    SAB IAB Ectopic Multiple Live Births  1            # Outcome Date GA Lbr Len/2nd Weight Sex Delivery Anes PTL Lv  1 SAB             Obstetric Comments  1st Menstrual Cycle: 13    Family History  Adopted: Yes  Problem Relation Age of Onset   Alcohol abuse Maternal Uncle    Alcohol abuse Paternal Uncle    Arthritis Maternal Grandmother    Heart disease Maternal Grandmother    Anxiety disorder Maternal Grandmother    Rheum arthritis Paternal Uncle    Arthritis Mother    Hypertension Mother    Anxiety disorder Mother    Arthritis Father    Hyperlipidemia Father    Hypertension Father    Uterine cancer Maternal Aunt    Hyperlipidemia Maternal Grandfather    Breast cancer Maternal Grandfather    Anxiety disorder Maternal Aunt    Ovarian cancer Maternal Aunt     Social History   Socioeconomic History  Marital status: Single    Spouse name: Not on file   Number of children: Not on file   Years of education: Not on file   Highest education level: Not on file  Occupational History   Not on file  Tobacco Use   Smoking status: Every Day    Packs/day: 0.50    Years: 14.00    Total pack years: 7.00    Types: Cigarettes   Smokeless tobacco: Never  Vaping Use   Vaping Use: Never used  Substance and Sexual Activity   Alcohol use: Yes    Alcohol/week: 3.0 standard drinks of alcohol    Types: 3 Glasses of wine per week     Comment: occasionally   Drug use: No   Sexual activity: Yes    Birth control/protection: None  Other Topics Concern   Not on file  Social History Narrative   Lives at home with boyfriend.    Social Determinants of Health   Financial Resource Strain: Not on file  Food Insecurity: Not on file  Transportation Needs: Not on file  Physical Activity: Not on file  Stress: Not on file  Social Connections: Not on file  Intimate Partner Violence: Not on file   Current Outpatient Medications on File Prior to Visit  Medication Sig Dispense Refill   norethindrone (AYGESTIN) 5 MG tablet Take 1 tablet (5 mg total) by mouth daily. 90 tablet 0   ZUBSOLV 2.9-0.71 MG SUBL      No current facility-administered medications on file prior to visit.   No Known Allergies    Review of Systems Constitutional: No recent fever/chills/sweats Respiratory: No recent cough/bronchitis Cardiovascular: No chest pain Gastrointestinal: No recent nausea/vomiting/diarrhea Genitourinary: No UTI symptoms Hematologic/lymphatic:No history of coagulopathy or recent blood thinner use    Objective:   Blood pressure (!) 146/70, pulse 81, resp. rate 16, height '5\' 3"'$  (1.6 m), weight 142 lb 1.6 oz (64.5 kg), last menstrual period 12/15/2021. CONSTITUTIONAL: Well-developed, well-nourished female in no acute distress.  HENT:  Normocephalic, atraumatic, External right and left ear normal. Oropharynx is clear and moist EYES: Conjunctivae and EOM are normal. Pupils are equal, round, and reactive to light. No scleral icterus.  NECK: Normal range of motion, supple, no masses SKIN: Skin is warm and dry. No rash noted. Not diaphoretic. No erythema. No pallor. NEUROLOGIC: Alert and oriented to person, place, and time. Normal reflexes, muscle tone coordination. No cranial nerve deficit noted. PSYCHIATRIC: Normal mood and affect. Normal behavior. Normal judgment and thought content. CARDIOVASCULAR: Normal heart rate noted, regular  rhythm RESPIRATORY: Effort and breath sounds normal, no problems with respiration noted ABDOMEN: normal findings: bowel sounds normal and soft, non-tender and abnormal findings:  mass, located in the lower abdomen (palpable up to 3-4 cm above pubic symphysis), mobile, non-tender.  PELVIC: Deferred, see previous note by provider dated 01/10/2023. MUSCULOSKELETAL: Normal range of motion. No edema and no tenderness. 2+ distal pulses.    Labs: Lab Results  Component Value Date   WBC 5.6 11/19/2022   HGB 6.9 (L) 11/19/2022   HCT 24.7 (L) 11/19/2022   MCV 69.0 (L) 11/19/2022   PLT 232 11/19/2022    Pathology:  ENDOMETRIUM, BIOPSY:  - Benign inactive endometrium  - Negative for hyperplasia or malignancy   Imaging Studies: Imaging:  US PELVIC COMPLETE W TRANSVAGINAL AND TORSION R/O CLINICAL DATA:  Indeterminate pelvic mass on CT.   EXAM: TRANSABDOMINAL AND TRANSVAGINAL ULTRASOUND OF PELVIS   DOPPLER ULTRASOUND OF OVARIES   TECHNIQUE: Both  transabdominal and transvaginal ultrasound examinations of the pelvis were performed. Transabdominal technique was performed for global imaging of the pelvis including uterus, ovaries, adnexal regions, and pelvic cul-de-sac.   It was necessary to proceed with endovaginal exam following the transabdominal exam to visualize the uterus. Color and duplex Doppler ultrasound was utilized to evaluate blood flow to the ovaries.   COMPARISON:  CT 11/04/2019   FINDINGS: Uterus   Measurements: 6.2 x 2.9 by 4.1 cm = volume: 49 mL. Uterus volume measure excludes the large leiomyoma. There is a large well-circumscribed mixed echogenicity mass in the uterine body measuring 11.1 x 8.8 x 11.0 cm. Mass extends along the RIGHT uterine wall.   Endometrium   Thickness: 14 mm.  Normal thickness for premenopausal female   Right ovary   Measurements: 4.2 x 2.4 x 2.7 cm = volume: 14.2 mL. Dominant follicle in the RIGHT ovary. Two small cystic regions  adjacent to the RIGHT ovary appears benign.   Left ovary   Measurements: Not identified = volume: mL. Normal appearance/no adnexal mass.   Pulsed Doppler evaluation of of the RIGHT ovary demonstrates normal low-resistance arterial and venous waveforms.   Other findings   No free fluid   IMPRESSION: 1. Large well-circumscribed leiomyoma involving the uterine fundus and body along the RIGHT side of the uterus. 2. Normal endometrium. 3. Normal RIGHT ovary. LEFT ovary not identified. LEFT ovary identified on comparison CT appears normal.   Electronically Signed   By: Suzy Bouchard M.D.   On: 11/04/2019 07:45 CT Renal Stone Study CLINICAL DATA:  Urinary retention   EXAM: CT ABDOMEN AND PELVIS WITHOUT CONTRAST   TECHNIQUE: Multidetector CT imaging of the abdomen and pelvis was performed following the standard protocol without IV contrast.   COMPARISON:  None.   FINDINGS: LOWER CHEST: No basilar pleural or apical pericardial effusion.   HEPATOBILIARY: Normal hepatic contours. There is no intra- or extrahepatic biliary dilatation. The gallbladder is normal.   PANCREAS: Normal pancreatic contours without pancreatic ductal dilatation or peripancreatic fluid collection.   SPLEEN: Normal.   ADRENALS/URINARY TRACT:   --Adrenal glands: Normal.   --Right kidney/ureter: No hydronephrosis, nephroureterolithiasis or solid renal mass.   --Left kidney/ureter: No hydronephrosis, nephroureterolithiasis or solid renal mass.   --Urinary bladder: Decompressed by Foley catheter.   STOMACH/BOWEL:   --Stomach/Duodenum: There is no hiatal hernia. The duodenal course and caliber are normal.   --Small bowel: No dilatation or inflammation.   --Colon: No focal abnormality.   --Appendix: Normal.   VASCULAR/LYMPHATIC: Normal course and caliber of the major abdominal vessels. No abdominal or pelvic lymphadenopathy.   REPRODUCTIVE: There is an intermediate attenuation mass of  the midline pelvis that measures 12.0 x 11.3 x 10.8 cm. This exerts marked mass effect on the urinary bladder anteriorly.   MUSCULOSKELETAL. No bony spinal canal stenosis or focal osseous abnormality.   OTHER: None.   IMPRESSION: Large pelvic mass causing mass effect on the urinary bladder and possibly causing bladder outlet obstruction. The mass is most likely a large uterine fibroid. Correlation with pelvic ultrasound might be helpful.   Electronically Signed   By: Ulyses Jarred M.D.   On: 11/04/2019 05:09  Assessment:    1. Preoperative exam for gynecologic surgery   2. Menorrhagia with regular cycle   3. Leiomyoma   4. History of ovarian cyst   5. Family history of ovarian cancer   6. Monoallelic mutation of ATM gene   7. Iron deficiency anemia due to chronic blood loss  Plan:   1. Preoperative exam for gynecologic surgery - Counseling: Procedure, risks, reasons, benefits and complications (including injury to bowel, bladder, major blood vessel, ureter, bleeding, possibility of transfusion, infection, or fistula formation) reviewed in detail. Likelihood of success in alleviating the patient's condition was discussed. Routine postoperative instructions will be reviewed with the patient and her family in detail after surgery.  The patient concurred with the proposed plan, giving informed written consent for the surgery.   - Preop testing ordered. - Instructions reviewed, including NPO after midnight.  2. Menorrhagia with regular cycle - Currently utilizing Aygestin for management. Notes lifelong history of heavy cycles.  Desires definitive management with hysterectomy.  Has been counseled on all other options previously.  3. Leiomyoma Enlarged fibroid uterus, with largest fibroid approximate 11 cm.  Discussed robotic laparoscopic hysterectomy versus possible abdominal hysterectomy.  Patient notes understanding of the possibility of conversion to open procedure.  4.  History of ovarian cyst -  Patient desires removal of ovaries at time of hysterectomy due to family history and personal history of ovarian cysts, family history of cancer and patient's own positive gene mutation. Discussed that this would place patient in surgical menopause, and would recommend hormonal replacement therapy until approximately age 70 or 53.  Patient notes understanding.  5. Monoallelic mutation of ATM gene Patient at higher risk for breast and ovarian cancer. Has not had a mammogram since 2018.  Order placed, patient notes she will have scheduled after her hysterectomy.   6. Family history of ovarian cancer -Desires removal of ovaries at time of hysterectomy.  Previously counseled on surgical menopause and implications.  7. Iron deficiency anemia due to chronic blood loss - Recently s/p 2 encounters for blood transfusions. Most recent Hgb last month was 6.9.  Currently receiving iron infusions prior to surgery.  Will be due for lab check tomorrow.  Also scheduled for 1 additional infusion next week prior to surgery.  Rubie Maid, MD [facility]

## 2023-02-04 NOTE — Patient Instructions (Addendum)
Your procedure is scheduled on: 02/10/2023  Report to the Registration Desk on the 1st floor of the [facility]. To find out your arrival time, please call [phone] between 1PM - 3PM on: 02/07/2023  If your arrival time is 6:00 am, do not arrive before that time as the [facility] entrance doors do not open until 6:00 am.  REMEMBER: Instructions that are not followed completely may result in serious medical risk, up to and including death; or upon the discretion of your surgeon and anesthesiologist your surgery may need to be rescheduled.  Do not eat food after midnight the night before surgery.  No gum chewing or hard candies.  You may however, drink CLEAR liquids up to 2 hours before you are scheduled to arrive for your surgery. Do not drink anything within 2 hours of your scheduled arrival time.  Clear liquids include: - water  - apple juice without pulp - gatorade (not RED colors) - black coffee or tea (Do NOT add milk or creamers to the coffee or tea) Do NOT drink anything that is not on this list.   In addition, your doctor has ordered for you to drink the provided:  Ensure Pre-Surgery Clear Carbohydrate Drink  Drinking this carbohydrate drink up to two hours before surgery helps to reduce insulin resistance and improve patient outcomes. Please complete drinking 2 hours before scheduled arrival time.  One week prior to surgery: Stop Anti-inflammatories (NSAIDS) such as Advil, Aleve, Ibuprofen, Motrin, Naproxen, Naprosyn and Aspirin based products such as Excedrin, Goody's Powder, BC Powder. Stop ANY OVER THE COUNTER supplements until after surgery. You may however, continue to take Tylenol if needed for pain up until the day of surgery.   No Alcohol for 24 hours before or after surgery.  No Smoking including e-cigarettes for 24 hours before surgery.  No chewable tobacco products for at least 6 hours before surgery.  No nicotine patches on the day of surgery.  Do not use  any "recreational" drugs for at least a week (preferably 2 weeks) before your surgery.  Please be advised that the combination of cocaine and anesthesia may have negative outcomes, up to and including death. If you test positive for cocaine, your surgery will be cancelled.  On the morning of surgery brush your teeth with toothpaste and water, you may rinse your mouth with mouthwash if you wish. Do not swallow any toothpaste or mouthwash.  Use CHG Soap as directed on instruction sheet.  Do not wear jewelry, make-up, hairpins, clips or nail polish.  Do not wear lotions, powders, or perfumes.   Do not shave body hair from the neck down 48 hours before surgery.  Contact lenses, hearing aids and dentures may not be worn into surgery.  Do not bring valuables to the hospital. [facility] is not responsible for any missing/lost belongings or valuables.   Notify your doctor if there is any change in your medical condition (cold, fever, infection).  Wear comfortable clothing (specific to your surgery type) to the hospital.  After surgery, you can help prevent lung complications by doing breathing exercises.  Take deep breaths and cough every 1-2 hours. Your doctor may order a device called an Incentive Spirometer to help you take deep breaths. If you are being admitted to the hospital overnight, leave your suitcase in the car. After surgery it may be brought to your room.  In case of increased patient census, it may be necessary for you, the patient, to continue your postoperative  care in the Same Day Surgery department.  If you are being discharged the day of surgery, you will not be allowed to drive home. You will need a responsible individual to drive you home and stay with you for 24 hours after surgery.   If you are taking public transportation, you will need to have a responsible individual with you.  Please call the [facility] at [phone] if you have any  questions about these instructions.  Surgery Visitation Policy:  Patients undergoing a surgery or procedure may have two family members or support persons with them as long as the person is not COVID-19 positive or experiencing its symptoms.   Due to an increase in RSV and influenza rates and associated hospitalizations, children ages 47 and under will not be able to visit patients in [facility]. Masks continue to be strongly recommended.      Preparing for Surgery with CHLORHEXIDINE GLUCONATE (CHG) Soap  Chlorhexidine Gluconate (CHG) Soap  o An antiseptic cleaner that kills germs and bonds with the skin to continue killing germs even after washing  o Used for showering the night before surgery and morning of surgery  Before surgery, you can play an important role by reducing the number of germs on your skin.  CHG (Chlorhexidine gluconate) soap is an antiseptic cleanser which kills germs and bonds with the skin to continue killing germs even after washing.  Please do not use if you have an allergy to CHG or antibacterial soaps. If your skin becomes reddened/irritated stop using the CHG.  1. Shower the NIGHT BEFORE SURGERY and the MORNING OF SURGERY with CHG soap.  2. If you choose to wash your hair, wash your hair first as usual with your normal shampoo.  3. After shampooing, rinse your hair and body thoroughly to remove the shampoo.  4. Use CHG as you would any other liquid soap. You can apply CHG directly to the skin and wash gently with a scrungie or a clean washcloth.  5. Apply the CHG soap to your body only from the neck down. Do not use on open wounds or open sores. Avoid contact with your eyes, ears, mouth, and genitals (private parts). Wash face and genitals (private parts) with your normal soap.  6. Wash thoroughly, paying special attention to the area where your surgery will be performed.  7. Thoroughly rinse your body with warm water.  8. Do not  shower/wash with your normal soap after using and rinsing off the CHG soap.  9. Pat yourself dry with a clean towel.  10. Wear clean pajamas to bed the night before surgery.  12. Place clean sheets on your bed the night of your first shower and do not sleep with pets.  13. Shower again with the CHG soap on the day of surgery prior to arriving at the hospital.  14. Do not apply any deodorants/lotions/powders.  15. Please wear clean clothes to the hospital.

## 2023-02-04 NOTE — Patient Instructions (Signed)
GYNECOLOGY PRE-OPERATIVE INSTRUCTIONS  You are scheduled for surgery on 02/10/2023.  The name of your procedure is: Laparoscopic Hysterectomy (Robotic) with Bilateral Salpingectomy.   Please read through these instructions carefully regarding preparation for your surgery: Nothing to eat after midnight on the day prior to surgery.  Do not take any medications unless recommended by your provider on day prior to surgery.  Do not take NSAIDs (Motrin, Aleve) or aspirin 7 days prior to surgery.  You may take Tylenol products for minor aches and pains.  You will receive a prescription for pain medications post-operatively.  Instructions will be given to you on when and where to have this performed.  Please call the office if you have any questions regarding your upcoming surgery.    Thank you for choosing [facility] at [facility].

## 2023-02-05 ENCOUNTER — Encounter: Payer: Self-pay | Admitting: Urgent Care

## 2023-02-05 ENCOUNTER — Encounter
Admission: RE | Admit: 2023-02-05 | Discharge: 2023-02-05 | Disposition: A | Discharge status: Home / Self Care | Payer: BC Managed Care – PPO | Source: Ambulatory Visit | Attending: Obstetrics and Gynecology | Admitting: Obstetrics and Gynecology

## 2023-02-05 DIAGNOSIS — Z01818 Encounter for other preprocedural examination: Secondary | ICD-10-CM

## 2023-02-05 DIAGNOSIS — Z01812 Encounter for preprocedural laboratory examination: Secondary | ICD-10-CM | POA: Diagnosis not present

## 2023-02-05 LAB — RAPID HIV SCREEN (HIV 1/2 AB+AG)
HIV 1/2 Antibodies: NONREACTIVE
HIV-1 P24 Antigen - HIV24: NONREACTIVE

## 2023-02-05 LAB — TSH: TSH: 2.087 u[IU]/mL (ref 0.350–4.500)

## 2023-02-05 LAB — CBC
HCT: 36.8 % (ref 36.0–46.0)
Hemoglobin: 10.9 g/dL — ABNORMAL LOW (ref 12.0–15.0)
MCH: 23.4 pg — ABNORMAL LOW (ref 26.0–34.0)
MCHC: 29.6 g/dL — ABNORMAL LOW (ref 30.0–36.0)
MCV: 79.1 fL — ABNORMAL LOW (ref 80.0–100.0)
Platelets: 220 10*3/uL (ref 150–400)
RBC: 4.65 MIL/uL (ref 3.87–5.11)
RDW: 28.6 % — ABNORMAL HIGH (ref 11.5–15.5)
WBC: 5.9 10*3/uL (ref 4.0–10.5)
nRBC: 0 % (ref 0.0–0.2)

## 2023-02-05 LAB — TYPE AND SCREEN
ABO/RH(D): O NEG
Antibody Screen: NEGATIVE
Extend sample reason: TRANSFUSED

## 2023-02-06 LAB — HEPATITIS C ANTIBODY: HCV Ab: NONREACTIVE

## 2023-02-09 MED ORDER — ACETAMINOPHEN 500 MG PO TABS
1000.0000 mg | ORAL_TABLET | ORAL | Status: AC
  Administered 2023-02-10: 1000 mg via ORAL

## 2023-02-09 MED ORDER — CEFAZOLIN SODIUM-DEXTROSE 2-4 GM/100ML-% IV SOLN
2.0000 g | INTRAVENOUS | Status: AC
  Administered 2023-02-10: 2 g via INTRAVENOUS

## 2023-02-09 MED ORDER — LACTATED RINGERS IV SOLN: INTRAVENOUS | Status: DC

## 2023-02-09 MED ORDER — FAMOTIDINE 20 MG PO TABS: 20.0000 mg | ORAL_TABLET | Freq: Once | ORAL | Status: AC

## 2023-02-09 MED ORDER — CELECOXIB 200 MG PO CAPS: 400.0000 mg | ORAL_CAPSULE | ORAL | Status: AC

## 2023-02-09 MED ORDER — POVIDONE-IODINE 10 % EX SWAB
2.0000 | Freq: Once | CUTANEOUS | Status: AC
  Administered 2023-02-10: 2 via TOPICAL

## 2023-02-09 MED ORDER — ORAL CARE MOUTH RINSE: 15.0000 mL | Freq: Once | OROMUCOSAL | Status: AC

## 2023-02-09 MED ORDER — GABAPENTIN 300 MG PO CAPS: 300.0000 mg | ORAL_CAPSULE | ORAL | Status: AC

## 2023-02-09 MED ORDER — CHLORHEXIDINE GLUCONATE 0.12 % MT SOLN: 15.0000 mL | Freq: Once | OROMUCOSAL | Status: AC

## 2023-02-10 ENCOUNTER — Other Ambulatory Visit: Payer: Self-pay

## 2023-02-10 ENCOUNTER — Ambulatory Visit
Admission: RE | Admit: 2023-02-10 | Discharge: 2023-02-10 | Disposition: A | Discharge status: Home / Self Care | Payer: BC Managed Care – PPO | Attending: Obstetrics and Gynecology | Admitting: Obstetrics and Gynecology

## 2023-02-10 ENCOUNTER — Ambulatory Visit: Payer: BC Managed Care – PPO | Admitting: Certified Registered Nurse Anesthetist

## 2023-02-10 ENCOUNTER — Encounter
Admission: RE | Disposition: A | Discharge status: Home / Self Care | Payer: Self-pay | Source: Home / Self Care | Attending: Obstetrics and Gynecology

## 2023-02-10 ENCOUNTER — Encounter: Payer: Self-pay | Admitting: Obstetrics and Gynecology

## 2023-02-10 DIAGNOSIS — N939 Abnormal uterine and vaginal bleeding, unspecified: Secondary | ICD-10-CM | POA: Diagnosis not present

## 2023-02-10 DIAGNOSIS — Z8041 Family history of malignant neoplasm of ovary: Secondary | ICD-10-CM | POA: Insufficient documentation

## 2023-02-10 DIAGNOSIS — Z1502 Genetic susceptibility to malignant neoplasm of ovary: Secondary | ICD-10-CM | POA: Insufficient documentation

## 2023-02-10 DIAGNOSIS — N72 Inflammatory disease of cervix uteri: Secondary | ICD-10-CM | POA: Insufficient documentation

## 2023-02-10 DIAGNOSIS — D259 Leiomyoma of uterus, unspecified: Secondary | ICD-10-CM | POA: Diagnosis not present

## 2023-02-10 DIAGNOSIS — N9489 Other specified conditions associated with female genital organs and menstrual cycle: Secondary | ICD-10-CM | POA: Diagnosis not present

## 2023-02-10 DIAGNOSIS — N920 Excessive and frequent menstruation with regular cycle: Secondary | ICD-10-CM | POA: Insufficient documentation

## 2023-02-10 DIAGNOSIS — N841 Polyp of cervix uteri: Secondary | ICD-10-CM | POA: Diagnosis not present

## 2023-02-10 DIAGNOSIS — N8003 Adenomyosis of the uterus: Secondary | ICD-10-CM | POA: Insufficient documentation

## 2023-02-10 DIAGNOSIS — F17210 Nicotine dependence, cigarettes, uncomplicated: Secondary | ICD-10-CM | POA: Diagnosis not present

## 2023-02-10 DIAGNOSIS — N838 Other noninflammatory disorders of ovary, fallopian tube and broad ligament: Secondary | ICD-10-CM | POA: Insufficient documentation

## 2023-02-10 DIAGNOSIS — D219 Benign neoplasm of connective and other soft tissue, unspecified: Secondary | ICD-10-CM

## 2023-02-10 DIAGNOSIS — D279 Benign neoplasm of unspecified ovary: Secondary | ICD-10-CM | POA: Insufficient documentation

## 2023-02-10 DIAGNOSIS — S83519A Sprain of anterior cruciate ligament of unspecified knee, initial encounter: Secondary | ICD-10-CM | POA: Diagnosis not present

## 2023-02-10 DIAGNOSIS — D500 Iron deficiency anemia secondary to blood loss (chronic): Secondary | ICD-10-CM | POA: Insufficient documentation

## 2023-02-10 DIAGNOSIS — Z90710 Acquired absence of both cervix and uterus: Secondary | ICD-10-CM

## 2023-02-10 DIAGNOSIS — N840 Polyp of corpus uteri: Secondary | ICD-10-CM | POA: Insufficient documentation

## 2023-02-10 DIAGNOSIS — Z1589 Genetic susceptibility to other disease: Secondary | ICD-10-CM | POA: Diagnosis not present

## 2023-02-10 DIAGNOSIS — Z01818 Encounter for other preprocedural examination: Secondary | ICD-10-CM

## 2023-02-10 HISTORY — PX: ROBOTIC ASSISTED TOTAL HYSTERECTOMY WITH BILATERAL SALPINGO OOPHERECTOMY: SHX6086

## 2023-02-10 LAB — TYPE AND SCREEN
ABO/RH(D): O NEG
Antibody Screen: NEGATIVE

## 2023-02-10 LAB — POCT PREGNANCY, URINE: Preg Test, Ur: NEGATIVE

## 2023-02-10 LAB — POCT I-STAT, CHEM 8
BUN: 9 mg/dL (ref 6–20)
Calcium, Ion: 1.2 mmol/L (ref 1.15–1.40)
Chloride: 105 mmol/L (ref 98–111)
Creatinine, Ser: 0.6 mg/dL (ref 0.44–1.00)
Glucose, Bld: 98 mg/dL (ref 70–99)
HCT: 39 % (ref 36.0–46.0)
Hemoglobin: 13.3 g/dL (ref 12.0–15.0)
Potassium: 4 mmol/L (ref 3.5–5.1)
Sodium: 138 mmol/L (ref 135–145)
TCO2: 22 mmol/L (ref 22–32)

## 2023-02-10 SURGERY — HYSTERECTOMY, TOTAL, ROBOT-ASSISTED, LAPAROSCOPIC, WITH BILATERAL SALPINGO-OOPHORECTOMY
Anesthesia: General | Site: Pelvis | Laterality: Bilateral

## 2023-02-10 MED ORDER — ACETAMINOPHEN 500 MG PO TABS
ORAL_TABLET | ORAL | Status: AC
  Filled 2023-02-10: qty 2

## 2023-02-10 MED ORDER — ACETAMINOPHEN 10 MG/ML IV SOLN
1000.0000 mg | Freq: Once | INTRAVENOUS | Status: AC
  Administered 2023-02-10: 1000 mg via INTRAVENOUS

## 2023-02-10 MED ORDER — DEXAMETHASONE SODIUM PHOSPHATE 10 MG/ML IJ SOLN
INTRAMUSCULAR | Status: DC | PRN
  Administered 2023-02-10: 10 mg via INTRAVENOUS

## 2023-02-10 MED ORDER — HYDROMORPHONE HCL 1 MG/ML IJ SOLN
0.2500 mg | INTRAMUSCULAR | Status: DC | PRN
  Administered 2023-02-10: 0.5 mg via INTRAVENOUS

## 2023-02-10 MED ORDER — HYDROMORPHONE HCL 1 MG/ML IJ SOLN
INTRAMUSCULAR | Status: AC
  Filled 2023-02-10: qty 1

## 2023-02-10 MED ORDER — OXYCODONE HCL 5 MG PO TABS
5.0000 mg | ORAL_TABLET | Freq: Once | ORAL | Status: AC | PRN
  Administered 2023-02-10: 5 mg via ORAL

## 2023-02-10 MED ORDER — CEFAZOLIN SODIUM-DEXTROSE 2-4 GM/100ML-% IV SOLN
INTRAVENOUS | Status: AC
  Filled 2023-02-10: qty 100

## 2023-02-10 MED ORDER — ACETAMINOPHEN 10 MG/ML IV SOLN
INTRAVENOUS | Status: AC
  Filled 2023-02-10: qty 100

## 2023-02-10 MED ORDER — SIMETHICONE 80 MG PO CHEW: 80.0000 mg | CHEWABLE_TABLET | Freq: Four times a day (QID) | ORAL | 2 refills | Status: DC | PRN

## 2023-02-10 MED ORDER — CHLORHEXIDINE GLUCONATE 0.12 % MT SOLN
OROMUCOSAL | Status: AC
  Administered 2023-02-10: 15 mL via OROMUCOSAL
  Filled 2023-02-10: qty 15

## 2023-02-10 MED ORDER — LABETALOL HCL 5 MG/ML IV SOLN
INTRAVENOUS | Status: DC | PRN
  Administered 2023-02-10: 2.5 mg via INTRAVENOUS

## 2023-02-10 MED ORDER — ESMOLOL HCL 100 MG/10ML IV SOLN
INTRAVENOUS | Status: AC
  Filled 2023-02-10: qty 10

## 2023-02-10 MED ORDER — ESTRADIOL 0.05 MG/24HR TD PTWK: 0.0500 mg | MEDICATED_PATCH | TRANSDERMAL | 12 refills | Status: DC

## 2023-02-10 MED ORDER — FENTANYL CITRATE (PF) 100 MCG/2ML IJ SOLN
INTRAMUSCULAR | Status: AC
  Filled 2023-02-10: qty 2

## 2023-02-10 MED ORDER — LIDOCAINE HCL (PF) 2 % IJ SOLN
INTRAMUSCULAR | Status: AC
  Filled 2023-02-10: qty 5

## 2023-02-10 MED ORDER — HYDROMORPHONE HCL 1 MG/ML IJ SOLN
INTRAMUSCULAR | Status: DC | PRN
  Administered 2023-02-10 (×2): .5 mg via INTRAVENOUS

## 2023-02-10 MED ORDER — ONDANSETRON HCL 4 MG/2ML IJ SOLN
INTRAMUSCULAR | Status: DC | PRN
  Administered 2023-02-10: 4 mg via INTRAVENOUS

## 2023-02-10 MED ORDER — BUPIVACAINE HCL 0.5 % IJ SOLN
INTRAMUSCULAR | Status: DC | PRN
  Administered 2023-02-10: 30 mL

## 2023-02-10 MED ORDER — ESTRADIOL 0.0375 MG/24HR TD PTWK: 0.0375 mg | MEDICATED_PATCH | TRANSDERMAL | Status: DC

## 2023-02-10 MED ORDER — ESMOLOL HCL 100 MG/10ML IV SOLN
INTRAVENOUS | Status: DC | PRN
  Administered 2023-02-10: 10 mg via INTRAVENOUS

## 2023-02-10 MED ORDER — EPHEDRINE SULFATE (PRESSORS) 50 MG/ML IJ SOLN
INTRAMUSCULAR | Status: DC | PRN
  Administered 2023-02-10: 5 mg via INTRAVENOUS

## 2023-02-10 MED ORDER — IBUPROFEN 600 MG PO TABS: 600.0000 mg | ORAL_TABLET | Freq: Four times a day (QID) | ORAL | 3 refills | Status: DC | PRN

## 2023-02-10 MED ORDER — DEXAMETHASONE SODIUM PHOSPHATE 10 MG/ML IJ SOLN
INTRAMUSCULAR | Status: AC
  Filled 2023-02-10: qty 1

## 2023-02-10 MED ORDER — GABAPENTIN 300 MG PO CAPS
ORAL_CAPSULE | ORAL | Status: AC
  Administered 2023-02-10: 300 mg via ORAL
  Filled 2023-02-10: qty 1

## 2023-02-10 MED ORDER — MIDAZOLAM HCL 2 MG/2ML IJ SOLN
INTRAMUSCULAR | Status: AC
  Filled 2023-02-10: qty 2

## 2023-02-10 MED ORDER — 0.9 % SODIUM CHLORIDE (POUR BTL) OPTIME
TOPICAL | Status: DC | PRN
  Administered 2023-02-10: 500 mL

## 2023-02-10 MED ORDER — KETOROLAC TROMETHAMINE 30 MG/ML IJ SOLN
INTRAMUSCULAR | Status: DC | PRN
  Administered 2023-02-10: 30 mg via INTRAVENOUS

## 2023-02-10 MED ORDER — LIDOCAINE HCL (CARDIAC) PF 100 MG/5ML IV SOSY
PREFILLED_SYRINGE | INTRAVENOUS | Status: DC | PRN
  Administered 2023-02-10: 60 mg via INTRAVENOUS

## 2023-02-10 MED ORDER — MIDAZOLAM HCL 2 MG/2ML IJ SOLN
INTRAMUSCULAR | Status: DC | PRN
  Administered 2023-02-10: 2 mg via INTRAVENOUS

## 2023-02-10 MED ORDER — OXYCODONE HCL 5 MG/5ML PO SOLN: 5.0000 mg | Freq: Once | ORAL | Status: AC | PRN

## 2023-02-10 MED ORDER — PROPOFOL 10 MG/ML IV BOLUS
INTRAVENOUS | Status: AC
  Filled 2023-02-10: qty 20

## 2023-02-10 MED ORDER — HYDROMORPHONE HCL 1 MG/ML IJ SOLN
INTRAMUSCULAR | Status: AC
  Administered 2023-02-10: 0.5 mg via INTRAVENOUS
  Filled 2023-02-10: qty 1

## 2023-02-10 MED ORDER — ROCURONIUM BROMIDE 10 MG/ML (PF) SYRINGE
PREFILLED_SYRINGE | INTRAVENOUS | Status: AC
  Filled 2023-02-10: qty 10

## 2023-02-10 MED ORDER — CELECOXIB 200 MG PO CAPS
ORAL_CAPSULE | ORAL | Status: AC
  Administered 2023-02-10: 400 mg via ORAL
  Filled 2023-02-10: qty 2

## 2023-02-10 MED ORDER — SUGAMMADEX SODIUM 200 MG/2ML IV SOLN
INTRAVENOUS | Status: DC | PRN
  Administered 2023-02-10: 180 mg via INTRAVENOUS

## 2023-02-10 MED ORDER — ESTRADIOL 0.05 MG/24HR TD PTWK
0.0500 mg | MEDICATED_PATCH | TRANSDERMAL | Status: DC
  Filled 2023-02-10: qty 1

## 2023-02-10 MED ORDER — OXYCODONE HCL 5 MG PO TABS
ORAL_TABLET | ORAL | Status: AC
  Filled 2023-02-10: qty 1

## 2023-02-10 MED ORDER — PROPOFOL 10 MG/ML IV BOLUS
INTRAVENOUS | Status: DC | PRN
  Administered 2023-02-10: 50 mg via INTRAVENOUS
  Administered 2023-02-10: 150 mg via INTRAVENOUS

## 2023-02-10 MED ORDER — METHYLENE BLUE 1 % INJ SOLN
INTRAVENOUS | Status: AC
  Filled 2023-02-10: qty 10

## 2023-02-10 MED ORDER — DEXMEDETOMIDINE HCL IN NACL 80 MCG/20ML IV SOLN
INTRAVENOUS | Status: DC | PRN
  Administered 2023-02-10 (×2): 8 ug via BUCCAL

## 2023-02-10 MED ORDER — FAMOTIDINE 20 MG PO TABS
ORAL_TABLET | ORAL | Status: AC
  Administered 2023-02-10: 20 mg via ORAL
  Filled 2023-02-10: qty 1

## 2023-02-10 MED ORDER — DEXMEDETOMIDINE HCL IN NACL 80 MCG/20ML IV SOLN
INTRAVENOUS | Status: AC
  Filled 2023-02-10: qty 20

## 2023-02-10 MED ORDER — FENTANYL CITRATE (PF) 100 MCG/2ML IJ SOLN
INTRAMUSCULAR | Status: DC | PRN
  Administered 2023-02-10 (×4): 50 ug via INTRAVENOUS

## 2023-02-10 MED ORDER — ONDANSETRON HCL 4 MG/2ML IJ SOLN
INTRAMUSCULAR | Status: AC
  Filled 2023-02-10: qty 2

## 2023-02-10 MED ORDER — BUPIVACAINE HCL (PF) 0.5 % IJ SOLN
INTRAMUSCULAR | Status: AC
  Filled 2023-02-10: qty 30

## 2023-02-10 MED ORDER — OXYCODONE-ACETAMINOPHEN 5-325 MG PO TABS: 1.0000 | ORAL_TABLET | ORAL | 0 refills | Status: DC | PRN

## 2023-02-10 MED ORDER — ROCURONIUM BROMIDE 100 MG/10ML IV SOLN
INTRAVENOUS | Status: DC | PRN
  Administered 2023-02-10: 10 mg via INTRAVENOUS
  Administered 2023-02-10: 30 mg via INTRAVENOUS
  Administered 2023-02-10: 50 mg via INTRAVENOUS
  Administered 2023-02-10: 20 mg via INTRAVENOUS
  Administered 2023-02-10: 10 mg via INTRAVENOUS

## 2023-02-10 MED ORDER — KETOROLAC TROMETHAMINE 30 MG/ML IJ SOLN
INTRAMUSCULAR | Status: AC
  Filled 2023-02-10: qty 1

## 2023-02-10 MED ORDER — LABETALOL HCL 5 MG/ML IV SOLN
INTRAVENOUS | Status: AC
  Filled 2023-02-10: qty 4

## 2023-02-10 MED ORDER — DOCUSATE SODIUM 100 MG PO CAPS: 100.0000 mg | ORAL_CAPSULE | Freq: Two times a day (BID) | ORAL | 2 refills | Status: AC | PRN

## 2023-02-10 SURGICAL SUPPLY — 77 items
ADH SKN CLS APL DERMABOND .7 (GAUZE/BANDAGES/DRESSINGS) ×1
BAG DRN RND TRDRP ANRFLXCHMBR (UROLOGICAL SUPPLIES) ×1
BAG URINE DRAIN 2000ML AR STRL (UROLOGICAL SUPPLIES) ×1 IMPLANT
BLADE SURG SZ11 CARB STEEL (BLADE) ×1 IMPLANT
CANNULA CAP OBTURATR AIRSEAL 8 (CAP) ×1 IMPLANT
CANNULA DILATOR 5 W/SLV (CANNULA) IMPLANT
CATH FOLEY 2WAY  5CC 16FR (CATHETERS) ×1
CATH FOLEY 2WAY 5CC 16FR (CATHETERS) ×1
CATH URTH 16FR FL 2W BLN LF (CATHETERS) ×1 IMPLANT
COVER TIP SHEARS 8 DVNC (MISCELLANEOUS) ×1 IMPLANT
COVER TIP SHEARS 8MM DA VINCI (MISCELLANEOUS) ×1
COVER WAND RF STERILE (DRAPES) ×1 IMPLANT
DERMABOND ADVANCED .7 DNX12 (GAUZE/BANDAGES/DRESSINGS) ×1 IMPLANT
DRAPE 3/4 80X56 (DRAPES) ×1 IMPLANT
DRAPE ARM DVNC X/XI (DISPOSABLE) ×4 IMPLANT
DRAPE COLUMN DVNC XI (DISPOSABLE) ×1 IMPLANT
DRAPE DA VINCI XI ARM (DISPOSABLE) ×4
DRAPE DA VINCI XI COLUMN (DISPOSABLE) ×1
DRAPE ROBOT W/ LEGGING 30X125 (DRAPES) ×1 IMPLANT
ELECT REM PT RETURN 9FT ADLT (ELECTROSURGICAL) ×1
ELECTRODE REM PT RTRN 9FT ADLT (ELECTROSURGICAL) ×1 IMPLANT
GAUZE 4X4 16PLY ~~LOC~~+RFID DBL (SPONGE) ×2 IMPLANT
GLOVE BIO SURGEON STRL SZ 6.5 (GLOVE) ×4 IMPLANT
GLOVE SURG POLY ORTHO LF SZ7.5 (GLOVE) IMPLANT
GLOVE SURG UNDER LTX SZ7 (GLOVE) ×4 IMPLANT
GOWN STRL REUS W/ TWL LRG LVL3 (GOWN DISPOSABLE) ×4 IMPLANT
GOWN STRL REUS W/TWL LRG LVL3 (GOWN DISPOSABLE) ×4
GRASPER SUT TROCAR 14GX15 (MISCELLANEOUS) ×1 IMPLANT
IRRIGATION STRYKERFLOW (MISCELLANEOUS) IMPLANT
IRRIGATOR STRYKERFLOW (MISCELLANEOUS)
IV NS 1000ML (IV SOLUTION) ×1
IV NS 1000ML BAXH (IV SOLUTION) ×1 IMPLANT
KIT IMAGING PINPOINTPAQ (MISCELLANEOUS) IMPLANT
KIT PINK PAD W/HEAD ARE REST (MISCELLANEOUS) ×1
KIT PINK PAD W/HEAD ARM REST (MISCELLANEOUS) ×1 IMPLANT
LABEL OR SOLS (LABEL) ×1 IMPLANT
MANIFOLD NEPTUNE II (INSTRUMENTS) ×1 IMPLANT
MANIPULATOR VCARE LG CRV RETR (MISCELLANEOUS) IMPLANT
MANIPULATOR VCARE SML CRV RETR (MISCELLANEOUS) IMPLANT
MANIPULATOR VCARE STD CRV RETR (MISCELLANEOUS) IMPLANT
NEEDLE VERESS 14GA 120MM (NEEDLE) ×1 IMPLANT
NS IRRIG 1000ML POUR BTL (IV SOLUTION) ×1 IMPLANT
OBTURATOR OPTICAL STANDARD 8MM (TROCAR) ×1
OBTURATOR OPTICAL STND 8 DVNC (TROCAR) ×1
OBTURATOR OPTICALSTD 8 DVNC (TROCAR) ×1 IMPLANT
OCCLUDER COLPOPNEUMO (BALLOONS) ×1 IMPLANT
PACK GYN LAPAROSCOPIC (MISCELLANEOUS) ×1 IMPLANT
PAD OB MATERNITY 4.3X12.25 (PERSONAL CARE ITEMS) ×1 IMPLANT
SCISSORS METZENBAUM CVD 33 (INSTRUMENTS) ×1 IMPLANT
SCRUB CHG 4% DYNA-HEX 4OZ (MISCELLANEOUS) ×1 IMPLANT
SEAL CANN UNIV 5-8 DVNC XI (MISCELLANEOUS) ×3 IMPLANT
SEAL XI 5MM-8MM UNIVERSAL (MISCELLANEOUS) ×3
SEALER VESSEL DA VINCI XI (MISCELLANEOUS)
SEALER VESSEL EXT DVNC XI (MISCELLANEOUS) IMPLANT
SET CYSTO W/LG BORE CLAMP LF (SET/KITS/TRAYS/PACK) IMPLANT
SET TUBE FILTERED XL AIRSEAL (SET/KITS/TRAYS/PACK) ×1 IMPLANT
SOL ELECTROSURG ANTI STICK (MISCELLANEOUS) ×1
SOL PREP PVP 2OZ (MISCELLANEOUS) ×1
SOLUTION ELECTROSURG ANTI STCK (MISCELLANEOUS) ×1 IMPLANT
SOLUTION PREP PVP 2OZ (MISCELLANEOUS) ×1 IMPLANT
SURGILUBE 2OZ TUBE FLIPTOP (MISCELLANEOUS) ×1 IMPLANT
SUT DVC VLOC 180 0 12IN GS21 (SUTURE) ×1
SUT MNCRL 4-0 (SUTURE) ×1
SUT MNCRL 4-0 27XMFL (SUTURE) ×1
SUT VIC AB 0 CT1 36 (SUTURE) IMPLANT
SUT VIC AB 2-0 CT1 27 (SUTURE) ×1
SUT VIC AB 2-0 CT1 TAPERPNT 27 (SUTURE) ×1 IMPLANT
SUT VICRYL 0 UR6 27IN ABS (SUTURE) ×1 IMPLANT
SUTURE DVC VLC 180 0 12IN GS21 (SUTURE) ×1 IMPLANT
SUTURE MNCRL 4-0 27XMF (SUTURE) ×1 IMPLANT
SYR 10ML LL (SYRINGE) ×1 IMPLANT
SYR 20ML LL LF (SYRINGE) IMPLANT
SYR 50ML LL SCALE MARK (SYRINGE) ×1 IMPLANT
TRAP FLUID SMOKE EVACUATOR (MISCELLANEOUS) ×1 IMPLANT
TROCAR Z-THRD FIOS HNDL 11X100 (TROCAR) ×1 IMPLANT
TUBING EVAC SMOKE HEATED PNEUM (TUBING) IMPLANT
WATER STERILE IRR 500ML POUR (IV SOLUTION) ×1 IMPLANT

## 2023-02-10 NOTE — Progress Notes (Signed)
Up ambulating and able to void w/o difficulty. States pain is tolerable. Drinking fluids. Voices readiness for discharge.

## 2023-02-10 NOTE — Discharge Instructions (Signed)

## 2023-02-10 NOTE — Op Note (Signed)
Procedure(s): XI ROBOTIC ASSISTED TOTAL LAPAROSCOPIC HYSTERECTOMY WITH BILATERAL SALPINGO OOPHORECTOMY Procedure Note  Ann Cox female 47 y.o. 02/10/2023  Indications: The patient is a 47 y.o. [id number] female with a history of menorrhagia with regular cycle, enlarged fibroid uterus (largest fibroid 11 cm), iron deficiency anemia.  Desiring definitive therapy with hysterectomy. Also with family history of ovarian cysts, personal history of ovarian cysts. 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    Pre-operative Diagnosis: Menorrhagia with regular cycle, enlarged fibroid uterus, iron deficiency anemia, family history of ovarian cancer, history of ovarian cysts, positive hereditary cancer screening.   Post-operative Diagnosis: Same  Surgeon: Rubie Maid, MD  Assistants:  Surgical nurse assist.   Anesthesia: General endotracheal anesthesia  Findings: Bimanual exam note uterus 14-16 week size, mobile.  Posterior mass palpable behind the cervix. The uterus was sounded to 14 cm, globular in appearance. Fallopian tubes appeared normal.   Multiple serous cystic lesions present on ovaries bilaterally.  Filmy adhesions of the omentum to the anterior surface of the uterus and at vaginal cuff.    Procedure Details: The patient was seen in the Holding Room. The risks, benefits, complications, treatment options, and expected outcomes were discussed with the patient.  The patient concurred with the proposed plan, giving informed consent.  The site of surgery properly noted/marked. The patient was taken to the Operating Room, identified as Ann Cox and the procedure verified as Procedure(s) (LRB): XI ROBOTIC ASSISTED TOTAL LAPAROSCOPIC HYSTERECTOMY WITH BILATERAL SALPINGO OOPHORECTOMY (Bilateral). A Time Out was held and the above information confirmed.  After induction of anesthesia, the patient was prepped and  draped in the usual sterile manner. Pt was placed in dorsal lithotomy position after anesthesia and draped and prepped in the usual sterile manner. Foley catheter was placed.  A sterile speculum was placed into the vagina.  The cervix was grasped with a single-tooth tenaculum and the uterus was sounded to 14 cm. A medium V-care device was then inserted for uterine manipulation. A vaginal  balloon occluder a lap pad was then placed inside the vagina to help with pneumoperitoneum.    Attention was then turned to the abdomen, where  an 8 mm incision was made in the superior aspect of the umbilicus. An 8 mm robotic trochar and sleeve were introduced into the abdominal cavity under direct visualization using a 5-mm laparoscope.  A pneumoperitoneum was established.  The daVinci camera was then placed supraumbilically. Three more ports were then placed. There were two 8 mm ports that were placed 10 cm laterally to the umbilicus and 2 cm inferiorly on either side.  The 11 mm assistant port was then placed in the right lower quadrant 2 cm medial and superiorr to the iliac crest. All incisions were injected with local anesthetic (Sensorcaine 0.5%, total of 30 cc) prior to port placement. The daVinci robot was then docked in the normal fashion. The patient was placed in steep Trendelenburg positioning.  Inspection of the pelvis showed the findings above. The filmy adhesions of the omentum were grasped, coagulated, and cut using the vessel sealer. The fallopian tube and ovary were grasped and the right infundibulopelvic was cauterized and cut using the vessel sealer device.  The right mesosalpinx of the fallopian tube was cauterized and cut using the vessel sealer device. The round ligament was coagulated and cut. A bladder flap was created and the bladder was dissected down from the cervix.  Attentions was turned to  the left side.  Due to difficulty in optimizing the position the bulky uterus, the left infundibulopelvic  ligament could not be grasped in it's entirety, so the utero-ovarian ligament and fallopian tube were coagulated and cut.   was also coagulated and cut.This entire procedure was then repeated on the left side. The round ligament was coagulated and cut. The remainder of the bladder flap was created and the bladder was again dissected down from the cervix.   The uterine arteries were then skeletonized, and cauterized using the vessel sealer device.  The V-care cup was then identified and anincision was made in the cervicovaginal junction on top of the vaginal cuff.  This incision was worked down on each lateral side.  Again due to the bulky nature and positioning of the uterus, the posterior vaginal cuff was more difficult to cut using the monopolar scissors. The Vessel Sealer was thus reintroduced to transect the remaining portion of the posterior vaginal wall, freeing the uterus from the surrounding vagina. The uterus with cervix was then brought down to the vagina and grasped with a Jacob's tenaculum. Attention was then turned again to the left adnexa.  The infundibulopelvic ligament was now more easily accessible, and this was transected and coagulated using the vessel sealer and brought down to the vaginal canal and delivered.  The cervix and uterus were then morcellated until the remainder of the uterus could be delivered through the vagina.    The vaginal cuff was closed with a running suture of 0 Vicryl V-lock. The ureters were identified bilaterally with peristalsis. The entire pelvis was hemostatic. The right assistant site was closed with a suture of -0 Vicryl in a figure-of eight manner using the PMI closure system and the cone. The skin was closed with 4-0 Monocryl using subcuticular stitches. Dermabond was placed over all incisions.  The final needle, sponge, and instrument count was correct. The patient tolerated the procedure well. Patient to the recovery room in good condition.     An  experienced assistant was required given the standard of surgical care given the complexity of the case.  This assistant was needed for exposure, dissection, suctioning, retraction, instrument exchange, and for overall help during the procedure.   Estimated Blood Loss:        Drains: straight catheterization prior to procedure with  600 ml of clear urine         Total IV Fluids:  1900 ml  Specimens: Uterus with cervix (morcellated), bilateral fallopian tubes and ovaries         Implants: None         Complications:  None; patient tolerated the procedure well.         Disposition: PACU - hemodynamically stable.         Condition: stable   Rubie Maid, MD [facility]

## 2023-02-10 NOTE — Transfer of Care (Signed)
Immediate Anesthesia Transfer of Care Note  Patient: Ann Cox  Procedure(s) Performed: XI ROBOTIC ASSISTED TOTAL LAPAROSCOPIC HYSTERECTOMY WITH BILATERAL SALPINGO OOPHORECTOMY (Bilateral: Pelvis)  Patient Location: PACU  Anesthesia Type:General  Level of Consciousness: awake and alert   Airway & Oxygen Therapy: Patient Spontanous Breathing  Post-op Assessment: Report given to RN and Post -op Vital signs reviewed and stable  Post vital signs: stable  Last Vitals:  Vitals Value Taken Time  BP 128/83 02/10/23 1348  Temp 36.6 C 02/10/23 1348  Pulse 83 02/10/23 1351  Resp 11 02/10/23 1351  SpO2 100 % 02/10/23 1351  Vitals shown include unvalidated device data.  Last Pain:  Vitals:   02/10/23 1348  TempSrc: Temporal  PainSc:          Complications: No notable events documented.

## 2023-02-10 NOTE — Anesthesia Procedure Notes (Signed)
Procedure Name: Intubation Date/Time: 02/10/2023 9:27 AM  Performed by: Jerrye Noble, CRNAPre-anesthesia Checklist: Patient identified, Emergency Drugs available, Suction available and Patient being monitored Patient Re-evaluated:Patient Re-evaluated prior to induction Oxygen Delivery Method: Circle system utilized Preoxygenation: Pre-oxygenation with 100% oxygen Induction Type: IV induction Ventilation: Mask ventilation without difficulty Laryngoscope Size: McGraph and 3 Grade View: Grade I Tube type: Oral Tube size: 6.5 mm Number of attempts: 1 Airway Equipment and Method: Stylet and Oral airway Placement Confirmation: ETT inserted through vocal cords under direct vision, positive ETCO2 and breath sounds checked- equal and bilateral Secured at: 22 cm Tube secured with: Tape Dental Injury: Teeth and Oropharynx as per pre-operative assessment

## 2023-02-10 NOTE — Anesthesia Postprocedure Evaluation (Signed)
Anesthesia Post Note  Patient: Paislee Marchionda  Procedure(s) Performed: XI ROBOTIC ASSISTED TOTAL LAPAROSCOPIC HYSTERECTOMY WITH BILATERAL SALPINGO OOPHORECTOMY (Bilateral: Pelvis)  Patient location during evaluation: PACU Anesthesia Type: General Level of consciousness: awake and alert Pain management: pain level controlled Vital Signs Assessment: post-procedure vital signs reviewed and stable Respiratory status: spontaneous breathing, nonlabored ventilation, respiratory function stable and patient connected to nasal cannula oxygen Cardiovascular status: blood pressure returned to baseline and stable Postop Assessment: no apparent nausea or vomiting Anesthetic complications: no   No notable events documented.   Last Vitals:  Vitals:   02/10/23 1348 02/10/23 1404  BP: 128/83 115/68  Pulse: 87 79  Resp: 14 10  Temp: 36.6 C   SpO2: 100% 98%    Last Pain:  Vitals:   02/10/23 1404  TempSrc:   PainSc: 6                  Ilene Qua

## 2023-02-10 NOTE — Anesthesia Preprocedure Evaluation (Addendum)
Anesthesia Evaluation  Patient identified by MRN, date of birth, ID band Patient awake    Reviewed: Allergy & Precautions, NPO status , Patient's Chart, lab work & pertinent test results  History of Anesthesia Complications Negative for: history of anesthetic complications  Airway Mallampati: III  TM Distance: >3 FB Neck ROM: full    Dental no notable dental hx.    Pulmonary Current Smoker and Patient abstained from smoking.   Pulmonary exam normal        Cardiovascular negative cardio ROS Normal cardiovascular exam     Neuro/Psych negative neurological ROS  negative psych ROS   GI/Hepatic negative GI ROS, Neg liver ROS,,,  Endo/Other  negative endocrine ROS    Renal/GU      Musculoskeletal   Abdominal   Peds  Hematology negative hematology ROS (+)   Anesthesia Other Findings Past Medical History: 03/10/2014: ACL (anterior cruciate ligament) rupture 02/2015: ACL sprain     Comment:  left 11/2015: BRCA negative     Comment:  MyRisk BRCA/BART neg No date: Family history of ovarian cancer No date: H/O urinary retention No date: History of recurrent UTI (urinary tract infection) No date: Iron deficiency anemia 02/2015: Medial meniscus tear     Comment:  left knee No date: Menorrhagia 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     Comment:  MyRisk testing; (increased risk of breast/pancreatic) No date: Ovarian cyst  Past Surgical History: 03/10/2015: ARTHROSCOPY WITH ANTERIOR CRUCIATE LIGAMENT (ACL) REPAIR  WITH ANTERIOR TIBILIAS GRAFT; Left     Comment:  Procedure: LEFT KNEE ARTHROSCOPY MEDIAL MENISCECTOMY                ANTERIOR CRUCIATE LIGAMENT (ACL) TIBIAL ANTERIOR               ALLOGRAFT;  Surgeon: Marchia Bond, MD;  Location: [place];  Service: Orthopedics;  Laterality:               Left; 03/10/2015: KNEE ARTHROSCOPY WITH MEDIAL MENISECTOMY; Left     Comment:   Procedure: KNEE ARTHROSCOPY WITH MEDIAL MENISECTOMY;                Surgeon: Marchia Bond, MD;  Location: [place];  Service: Orthopedics;  Laterality: Left;  BMI    Body Mass Index: 25.17 kg/m      Reproductive/Obstetrics negative OB ROS                             Anesthesia Physical Anesthesia Plan  ASA: 2  Anesthesia Plan: General ETT   Post-op Pain Management: Tylenol PO (pre-op)*, Celebrex PO (pre-op)*, Gabapentin PO (pre-op)* and Dilaudid IV   Induction: Intravenous  PONV Risk Score and Plan: Ondansetron, Dexamethasone, Midazolam and Treatment may vary due to age or medical condition  Airway Management Planned: Oral ETT  Additional Equipment:   Intra-op Plan:   Post-operative Plan: Extubation in OR  Informed Consent: I have reviewed the patients History and Physical, chart, labs and discussed the procedure including the risks, benefits and alternatives for the proposed anesthesia with the patient or authorized representative who has indicated his/her understanding and acceptance.     Dental Advisory Given  Plan Discussed with: Anesthesiologist, CRNA and Surgeon  Anesthesia Plan Comments: (Patient consented for risks of anesthesia  including but not limited to:  - adverse reactions to medications - damage to eyes, teeth, lips or other oral mucosa - nerve damage due to positioning  - sore throat or hoarseness - Damage to heart, brain, nerves, lungs, other parts of body or loss of life  Patient voiced understanding.)       Anesthesia Quick Evaluation

## 2023-02-10 NOTE — H&P (Signed)
GYNECOLOGY PREOPERATIVE HISTORY AND PHYSICAL   Subjective:  Ann Cox is a 47 y.o. G1P0010 here for surgical management of  XI ROBOTIC ASSISTED TOTAL LAPAROSCOPIC HYSTERECTOMY WITH BILATERAL SALPINGO OOPHORECTOMY.   Indications for procedure include: Menorrhagia with regular cycle and leiomyoma.  She has a history of fibroid uterus and also has hx of menorrhagia all through her life. 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 Her most recent Hgb was 6.9 on 11/19/2022. Recently had iron infusion last week. Currently denies symptoms today.  Was previously on OCPs, but recently was changed to Aygestin for better management of her bleeding. Is experiencing pelvic pain and pressure.  Last ultrasound noted an 11 cm fibroid.    Proposed surgery: XI ROBOTIC ASSISTED TOTAL LAPAROSCOPIC HYSTERECTOMY WITH BILATERAL SALPINGO OOPHORECTOMY    Pertinent Gynecological History: Patient's last menstrual period was 12/15/2021 (approximate).  Bleeding: dysfunctional uterine bleeding Contraception: none Last mammogram: normal Date: 12/17/2016. Plans to schedule after surgery.  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   MyRisk testing; (increased risk of breast/pancreatic)   Ovarian cyst     Past Surgical History:  Procedure Laterality Date   ARTHROSCOPY WITH  ANTERIOR CRUCIATE LIGAMENT (ACL) REPAIR WITH ANTERIOR TIBILIAS GRAFT Left 03/10/2015   Procedure: LEFT KNEE ARTHROSCOPY MEDIAL MENISCECTOMY  ANTERIOR CRUCIATE LIGAMENT (ACL) TIBIAL ANTERIOR ALLOGRAFT;  Surgeon: Marchia Bond, MD;  Location: [facility];  Service: Orthopedics;  Laterality: Left;   KNEE ARTHROSCOPY WITH MEDIAL MENISECTOMY Left 03/10/2015   Procedure: KNEE ARTHROSCOPY WITH MEDIAL MENISECTOMY;  Surgeon: Marchia Bond, MD;  Location: [facility];  Service: Orthopedics;  Laterality: Left;    OB History  Gravida Para Term Preterm AB Living  1       1    SAB IAB Ectopic Multiple Live Births  1            # Outcome Date GA Lbr Len/2nd Weight Sex Delivery Anes PTL Lv  1 SAB             Obstetric Comments  1st Menstrual Cycle: 13    Family History  Adopted: Yes  Problem Relation Age of Onset   Alcohol abuse Maternal Uncle    Alcohol abuse Paternal Uncle    Arthritis Maternal Grandmother    Heart disease Maternal Grandmother    Anxiety disorder Maternal Grandmother    Rheum arthritis Paternal Uncle    Arthritis Mother    Hypertension Mother    Anxiety disorder Mother    Arthritis Father    Hyperlipidemia Father    Hypertension Father    Uterine cancer Maternal Aunt    Hyperlipidemia Maternal Grandfather    Breast cancer Maternal Grandfather    Anxiety disorder Maternal Aunt    Ovarian cancer Maternal Aunt  Social History   Socioeconomic History   Marital status: Single    Spouse name: Not on file   Number of children: Not on file   Years of education: Not on file   Highest education level: Not on file  Occupational History   Not on file  Tobacco Use   Smoking status: Every Day    Packs/day: 0.50    Years: 14.00    Total pack years: 7.00    Types: Cigarettes   Smokeless tobacco: Never  Vaping Use   Vaping Use: Never used  Substance and Sexual Activity   Alcohol use: Yes    Alcohol/week: 3.0 standard drinks of alcohol     Types: 3 Glasses of wine per week    Comment: occasionally   Drug use: No   Sexual activity: Yes    Birth control/protection: None  Other Topics Concern   Not on file  Social History Narrative   Lives at home with boyfriend.    Social Determinants of Health   Financial Resource Strain: Not on file  Food Insecurity: Not on file  Transportation Needs: Not on file  Physical Activity: Not on file  Stress: Not on file  Social Connections: Not on file  Intimate Partner Violence: Not on file   No current facility-administered medications on file prior to encounter.   Current Outpatient Medications on File Prior to Encounter  Medication Sig Dispense Refill   norethindrone (AYGESTIN) 5 MG tablet Take 1 tablet (5 mg total) by mouth daily. 90 tablet 0   ZUBSOLV 2.9-0.71 MG SUBL      No Known Allergies    Review of Systems Constitutional: No recent fever/chills/sweats Respiratory: No recent cough/bronchitis Cardiovascular: No chest pain Gastrointestinal: No recent nausea/vomiting/diarrhea Genitourinary: No UTI symptoms Hematologic/lymphatic:No history of coagulopathy or recent blood thinner use    Objective:   Blood pressure (!) 144/84, pulse 86, temperature 97.6 F (36.4 C), temperature source Oral, resp. rate 18, height '5\' 3"'$  (1.6 m), weight 64.4 kg, SpO2 100 %. CONSTITUTIONAL: Well-developed, well-nourished female in no acute distress.  HENT:  Normocephalic, atraumatic, External right and left ear normal. Oropharynx is clear and moist EYES: Conjunctivae and EOM are normal. Pupils are equal, round, and reactive to light. No scleral icterus.  NECK: Normal range of motion, supple, no masses SKIN: Skin is warm and dry. No rash noted. Not diaphoretic. No erythema. No pallor. NEUROLOGIC: Alert and oriented to person, place, and time. Normal reflexes, muscle tone coordination. No cranial nerve deficit noted. PSYCHIATRIC: Normal mood and affect. Normal behavior. Normal judgment and  thought content. CARDIOVASCULAR: Normal heart rate noted, regular rhythm RESPIRATORY: Effort and breath sounds normal, no problems with respiration noted ABDOMEN: normal findings: bowel sounds normal and soft, non-tender and abnormal findings:  mass, located in the lower abdomen (palpable up to 3-4 cm above pubic symphysis), mobile, non-tender.  PELVIC: Deferred, see previous note by provider dated 01/10/2023. MUSCULOSKELETAL: Normal range of motion. No edema and no tenderness. 2+ distal pulses.    Labs:    Latest Ref Rng & Units 02/10/2023    7:40 AM 02/05/2023    3:17 PM 11/19/2022    8:04 AM  CBC  WBC 4.0 - 10.5 K/uL  5.9  5.6   Hemoglobin 12.0 - 15.0 g/dL 13.3  10.9  6.9   Hematocrit 36.0 - 46.0 % 39.0  36.8  24.7   Platelets 150 - 400 K/uL  220  232      Pathology:  ENDOMETRIUM, BIOPSY (performed  01/10/2023):  - Benign inactive endometrium  - Negative for hyperplasia or malignancy   Imaging Studies: Imaging:  US PELVIC COMPLETE W TRANSVAGINAL AND TORSION R/O CLINICAL DATA:  Indeterminate pelvic mass on CT.   EXAM: TRANSABDOMINAL AND TRANSVAGINAL ULTRASOUND OF PELVIS   DOPPLER ULTRASOUND OF OVARIES   TECHNIQUE: Both transabdominal and transvaginal ultrasound examinations of the pelvis were performed. Transabdominal technique was performed for global imaging of the pelvis including uterus, ovaries, adnexal regions, and pelvic cul-de-sac.   It was necessary to proceed with endovaginal exam following the transabdominal exam to visualize the uterus. Color and duplex Doppler ultrasound was utilized to evaluate blood flow to the ovaries.   COMPARISON:  CT 11/04/2019   FINDINGS: Uterus   Measurements: 6.2 x 2.9 by 4.1 cm = volume: 49 mL. Uterus volume measure excludes the large leiomyoma. There is a large well-circumscribed mixed echogenicity mass in the uterine body measuring 11.1 x 8.8 x 11.0 cm. Mass extends along the RIGHT uterine wall.   Endometrium    Thickness: 14 mm.  Normal thickness for premenopausal female   Right ovary   Measurements: 4.2 x 2.4 x 2.7 cm = volume: 14.2 mL. Dominant follicle in the RIGHT ovary. Two small cystic regions adjacent to the RIGHT ovary appears benign.   Left ovary   Measurements: Not identified = volume: mL. Normal appearance/no adnexal mass.   Pulsed Doppler evaluation of of the RIGHT ovary demonstrates normal low-resistance arterial and venous waveforms.   Other findings   No free fluid   IMPRESSION: 1. Large well-circumscribed leiomyoma involving the uterine fundus and body along the RIGHT side of the uterus. 2. Normal endometrium. 3. Normal RIGHT ovary. LEFT ovary not identified. LEFT ovary identified on comparison CT appears normal.   Electronically Signed   By: Suzy Bouchard M.D.   On: 11/04/2019 07:45 CT Renal Stone Study CLINICAL DATA:  Urinary retention   EXAM: CT ABDOMEN AND PELVIS WITHOUT CONTRAST   TECHNIQUE: Multidetector CT imaging of the abdomen and pelvis was performed following the standard protocol without IV contrast.   COMPARISON:  None.   FINDINGS: LOWER CHEST: No basilar pleural or apical pericardial effusion.   HEPATOBILIARY: Normal hepatic contours. There is no intra- or extrahepatic biliary dilatation. The gallbladder is normal.   PANCREAS: Normal pancreatic contours without pancreatic ductal dilatation or peripancreatic fluid collection.   SPLEEN: Normal.   ADRENALS/URINARY TRACT:   --Adrenal glands: Normal.   --Right kidney/ureter: No hydronephrosis, nephroureterolithiasis or solid renal mass.   --Left kidney/ureter: No hydronephrosis, nephroureterolithiasis or solid renal mass.   --Urinary bladder: Decompressed by Foley catheter.   STOMACH/BOWEL:   --Stomach/Duodenum: There is no hiatal hernia. The duodenal course and caliber are normal.   --Small bowel: No dilatation or inflammation.   --Colon: No focal abnormality.    --Appendix: Normal.   VASCULAR/LYMPHATIC: Normal course and caliber of the major abdominal vessels. No abdominal or pelvic lymphadenopathy.   REPRODUCTIVE: There is an intermediate attenuation mass of the midline pelvis that measures 12.0 x 11.3 x 10.8 cm. This exerts marked mass effect on the urinary bladder anteriorly.   MUSCULOSKELETAL. No bony spinal canal stenosis or focal osseous abnormality.   OTHER: None.   IMPRESSION: Large pelvic mass causing mass effect on the urinary bladder and possibly causing bladder outlet obstruction. The mass is most likely a large uterine fibroid. Correlation with pelvic ultrasound might be helpful.   Electronically Signed   By: Ulyses Jarred M.D.   On: 11/04/2019 05:09  Assessment:  Fibroid uterus Iron deficiency anemia due to chronic blood loss Menorrhagia with regular cycle  History of ovarian cyst Family history of ovarian cancer Monoallellic mutation of ATM gene   Plan:   1. Preoperative exam for gynecologic surgery - Counseling: Procedure, risks, reasons, benefits and complications (including injury to bowel, bladder, major blood vessel, ureter, bleeding, possibility of transfusion, infection, or fistula formation) reviewed in detail. Likelihood of success in alleviating the patient's condition was discussed. Routine postoperative instructions will be reviewed with the patient and her family in detail after surgery.  The patient concurred with the proposed plan, giving informed written consent for the surgery.   - Preop testing reviewed. - Patient has been NPO except clears since 0300.   2. Menorrhagia with regular cycle - Currently utilizing Aygestin for management. Notes lifelong history of heavy cycles.  Desires definitive management with hysterectomy.  Has been counseled on all other options previously.  3. Leiomyoma Enlarged fibroid uterus, with largest fibroid approximate 11 cm.  Discussed robotic laparoscopic hysterectomy  versus possible abdominal hysterectomy.  Patient notes understanding of the possibility of conversion to open procedure.  4. History of ovarian cyst -  Patient desires removal of ovaries at time of hysterectomy due to family history and personal history of ovarian cysts, family history of cancer and patient's own positive gene mutation. Discussed that this would place patient in surgical menopause, and would recommend hormonal replacement therapy until approximately age 70 or 63.  Patient notes understanding.  5. Monoallelic mutation of ATM gene Patient at higher risk for breast and ovarian cancer. Has not had a mammogram since 2018.  Order placed, patient notes she will have scheduled after her hysterectomy.   6. Family history of ovarian cancer -Desires removal of ovaries at time of hysterectomy.  Previously counseled on surgical menopause and implications.  7. Iron deficiency anemia due to chronic blood loss - Recently s/p 2 encounters for blood transfusions. Most recent Hgb last month was 6.9.  Currently receiving iron infusions prior to surgery.     Rubie Maid, MD [place]

## 2023-02-11 ENCOUNTER — Encounter: Payer: Self-pay | Admitting: Obstetrics and Gynecology

## 2023-02-11 ENCOUNTER — Telehealth: Payer: Self-pay

## 2023-02-11 DIAGNOSIS — F4310 Post-traumatic stress disorder, unspecified: Secondary | ICD-10-CM | POA: Diagnosis not present

## 2023-02-11 LAB — SURGICAL PATHOLOGY

## 2023-02-11 MED ORDER — ONDANSETRON HCL 4 MG PO TABS: 4.0000 mg | ORAL_TABLET | Freq: Three times a day (TID) | ORAL | 1 refills | Status: DC | PRN

## 2023-02-11 MED ORDER — KETOROLAC TROMETHAMINE 10 MG PO TABS: 10.0000 mg | ORAL_TABLET | Freq: Four times a day (QID) | ORAL | 0 refills | Status: DC | PRN

## 2023-02-11 NOTE — Telephone Encounter (Signed)
[facility] called triage she's cold, sweaty, nausea.  Spoke with Dr. Marcelline Mates plan is 1000 Tylenol, Toradol 10 mg q 6 no refills, Zofran 4 mg one refill. Dimple Casey and she understood told her to call us back if no relief.

## 2023-02-13 ENCOUNTER — Telehealth: Payer: Self-pay

## 2023-02-13 NOTE — Telephone Encounter (Signed)
Spoke with Ann Cox regarding FMLA forms, after speaking with Dr. Marcelline Mates we can only do 6-8 weeks for healing after hysterectomy. Called Ann Cox to let her know we cannot do the June 4th date, that 8 weeks is April 29th and she should be able to return to work by then only if she hasn't had any complications. She understood.

## 2023-02-13 NOTE — Telephone Encounter (Signed)
Hey Dr. Marcelline Mates.   I am currently working on [employer] forms for her Hysterectomy, she has her return to work June 4th. I was advised to check with you do we write them out for 3 months?

## 2023-02-17 NOTE — Progress Notes (Unsigned)
    OBSTETRICS/GYNECOLOGY POST-OPERATIVE CLINIC VISIT  Subjective:     Ann Cox is a 47 y.o. female who presents to the clinic 1 weeks status post XI ROBOTIC ASSISTED TOTAL LAPAROSCOPIC HYSTERECTOMY WITH BILATERAL SALPINGO OOPHORECTOMY  for Fibroid uterus, Symptomatic anemia, menorrhagia with regular cycle . Eating a regular diet {with-without:5700} difficulty. Bowel movements are {normal/abnormal***:19619}. {pain control:13522::"The patient is not having any pain."}  {Common ambulatory SmartLinks:19316}  Review of Systems {ros; complete:30496}   Objective:   There were no vitals taken for this visit. There is no height or weight on file to calculate BMI.  General:  alert and no distress  Abdomen: soft, bowel sounds active, non-tender  Incision:   {incision:13716::"no dehiscence","incision well approximated","healing well","no drainage","no erythema","no hernia","no seroma","no swelling"}    Pathology:    Assessment:   Patient s/p XI ROBOTIC ASSISTED TOTAL LAPAROSCOPIC HYSTERECTOMY WITH BILATERAL SALPINGO OOPHORECTOMY (surgery)  {doing well:13525::"Doing well postoperatively."}   Plan:   1. Continue any current medications as instructed by provider. 2. Wound care discussed. 3. Operative findings again reviewed. Pathology report discussed. 4. Activity restrictions: {restrictions:13723} 5. Anticipated return to work: {work return:14002}. 6. Follow up: {0-04:59977} {time; units:18646} for ***    Rubie Maid, MD [place]

## 2023-02-18 ENCOUNTER — Encounter: Payer: Self-pay | Admitting: Obstetrics and Gynecology

## 2023-02-18 ENCOUNTER — Ambulatory Visit (INDEPENDENT_AMBULATORY_CARE_PROVIDER_SITE_OTHER): Payer: BC Managed Care – PPO | Admitting: Obstetrics and Gynecology

## 2023-02-18 VITALS — BP 125/69 | HR 85 | Resp 16 | Ht 63.0 in | Wt 134.6 lb

## 2023-02-18 DIAGNOSIS — Z5189 Encounter for other specified aftercare: Secondary | ICD-10-CM

## 2023-02-18 DIAGNOSIS — Z90710 Acquired absence of both cervix and uterus: Secondary | ICD-10-CM

## 2023-02-18 DIAGNOSIS — Z4889 Encounter for other specified surgical aftercare: Secondary | ICD-10-CM

## 2023-02-18 DIAGNOSIS — N80209 Endometriosis of unspecified fallopian tube, unspecified depth: Secondary | ICD-10-CM | POA: Insufficient documentation

## 2023-02-19 DIAGNOSIS — F411 Generalized anxiety disorder: Secondary | ICD-10-CM | POA: Diagnosis not present

## 2023-02-24 DIAGNOSIS — Z7151 Drug abuse counseling and surveillance of drug abuser: Secondary | ICD-10-CM | POA: Diagnosis not present

## 2023-03-06 ENCOUNTER — Ambulatory Visit
Admission: RE | Admit: 2023-03-06 | Discharge: 2023-03-06 | Disposition: A | Discharge status: Home / Self Care | Payer: BC Managed Care – PPO | Source: Ambulatory Visit | Attending: Obstetrics and Gynecology | Admitting: Obstetrics and Gynecology

## 2023-03-06 DIAGNOSIS — Z1589 Genetic susceptibility to other disease: Secondary | ICD-10-CM | POA: Diagnosis not present

## 2023-03-06 DIAGNOSIS — Z1231 Encounter for screening mammogram for malignant neoplasm of breast: Secondary | ICD-10-CM

## 2023-03-06 DIAGNOSIS — Z1501 Genetic susceptibility to malignant neoplasm of breast: Secondary | ICD-10-CM | POA: Insufficient documentation

## 2023-03-06 DIAGNOSIS — R921 Mammographic calcification found on diagnostic imaging of breast: Secondary | ICD-10-CM | POA: Diagnosis not present

## 2023-03-06 DIAGNOSIS — Z1509 Genetic susceptibility to other malignant neoplasm: Secondary | ICD-10-CM | POA: Insufficient documentation

## 2023-03-07 ENCOUNTER — Other Ambulatory Visit: Payer: Self-pay | Admitting: Obstetrics and Gynecology

## 2023-03-07 DIAGNOSIS — R928 Other abnormal and inconclusive findings on diagnostic imaging of breast: Secondary | ICD-10-CM

## 2023-03-07 DIAGNOSIS — R921 Mammographic calcification found on diagnostic imaging of breast: Secondary | ICD-10-CM

## 2023-03-07 DIAGNOSIS — N6489 Other specified disorders of breast: Secondary | ICD-10-CM

## 2023-03-13 ENCOUNTER — Ambulatory Visit
Admission: RE | Admit: 2023-03-13 | Discharge: 2023-03-13 | Disposition: A | Discharge status: Home / Self Care | Payer: BC Managed Care – PPO | Source: Ambulatory Visit | Attending: Obstetrics and Gynecology | Admitting: Obstetrics and Gynecology

## 2023-03-13 DIAGNOSIS — R921 Mammographic calcification found on diagnostic imaging of breast: Secondary | ICD-10-CM | POA: Diagnosis not present

## 2023-03-13 DIAGNOSIS — N6489 Other specified disorders of breast: Secondary | ICD-10-CM

## 2023-03-13 DIAGNOSIS — R928 Other abnormal and inconclusive findings on diagnostic imaging of breast: Secondary | ICD-10-CM | POA: Insufficient documentation

## 2023-03-13 DIAGNOSIS — R922 Inconclusive mammogram: Secondary | ICD-10-CM | POA: Diagnosis not present

## 2023-03-14 ENCOUNTER — Other Ambulatory Visit: Payer: Self-pay | Admitting: Obstetrics and Gynecology

## 2023-03-14 ENCOUNTER — Encounter: Payer: Self-pay | Admitting: Obstetrics and Gynecology

## 2023-03-14 DIAGNOSIS — R928 Other abnormal and inconclusive findings on diagnostic imaging of breast: Secondary | ICD-10-CM

## 2023-03-14 DIAGNOSIS — R921 Mammographic calcification found on diagnostic imaging of breast: Secondary | ICD-10-CM

## 2023-03-19 DIAGNOSIS — F1110 Opioid abuse, uncomplicated: Secondary | ICD-10-CM | POA: Diagnosis not present

## 2023-03-19 DIAGNOSIS — F1120 Opioid dependence, uncomplicated: Secondary | ICD-10-CM | POA: Diagnosis not present

## 2023-03-19 DIAGNOSIS — F411 Generalized anxiety disorder: Secondary | ICD-10-CM | POA: Diagnosis not present

## 2023-03-20 ENCOUNTER — Ambulatory Visit
Admission: RE | Admit: 2023-03-20 | Discharge: 2023-03-20 | Disposition: A | Discharge status: Home / Self Care | Payer: BC Managed Care – PPO | Source: Ambulatory Visit | Attending: Obstetrics and Gynecology | Admitting: Obstetrics and Gynecology

## 2023-03-20 DIAGNOSIS — R928 Other abnormal and inconclusive findings on diagnostic imaging of breast: Secondary | ICD-10-CM | POA: Insufficient documentation

## 2023-03-20 DIAGNOSIS — R921 Mammographic calcification found on diagnostic imaging of breast: Secondary | ICD-10-CM | POA: Insufficient documentation

## 2023-03-20 DIAGNOSIS — Z1239 Encounter for other screening for malignant neoplasm of breast: Secondary | ICD-10-CM | POA: Diagnosis not present

## 2023-03-20 HISTORY — PX: BREAST BIOPSY: SHX20

## 2023-03-20 MED ORDER — LIDOCAINE-EPINEPHRINE 1 %-1:100000 IJ SOLN
10.0000 mL | Freq: Once | INTRAMUSCULAR | Status: AC
  Administered 2023-03-20: 10 mL

## 2023-03-20 MED ORDER — LIDOCAINE HCL (PF) 1 % IJ SOLN
15.0000 mL | Freq: Once | INTRAMUSCULAR | Status: AC
  Administered 2023-03-20: 15 mL

## 2023-03-21 ENCOUNTER — Encounter: Payer: Self-pay | Admitting: *Deleted

## 2023-03-21 LAB — SURGICAL PATHOLOGY

## 2023-03-21 NOTE — Progress Notes (Signed)
Received referral for newly diagnosed breast cancer from [facility].  Navigation initiated.  I will call Ann Cox back on Monday and see who she has decided that she would like to see.

## 2023-03-24 ENCOUNTER — Encounter: Payer: Self-pay | Admitting: *Deleted

## 2023-03-24 DIAGNOSIS — F1111 Opioid abuse, in remission: Secondary | ICD-10-CM | POA: Diagnosis not present

## 2023-03-24 DIAGNOSIS — Z7151 Drug abuse counseling and surveillance of drug abuser: Secondary | ICD-10-CM | POA: Diagnosis not present

## 2023-03-24 DIAGNOSIS — D0512 Intraductal carcinoma in situ of left breast: Secondary | ICD-10-CM

## 2023-03-24 NOTE — Progress Notes (Unsigned)
    OBSTETRICS/GYNECOLOGY POST-OPERATIVE CLINIC VISIT  Subjective:     Ann Cox is a 47 y.o. female who presents to the clinic 2 weeks status post  XI ROBOTIC ASSISTED TOTAL LAPAROSCOPIC HYSTERECTOMY WITH BILATERAL SALPINGO OOPHORECTOMY  for {gyn surg indications maj:13998}. Eating a regular diet {with-without:5700} difficulty. Bowel movements are {normal/abnormal***:19619}. {pain control:13522::"The patient is not having any pain."}  {Common ambulatory SmartLinks:19316}  Review of Systems {ros; complete:30496}   Objective:   LMP 12/15/2021 (Approximate)  There is no height or weight on file to calculate BMI.  General:  alert and no distress  Abdomen: soft, bowel sounds active, non-tender  Incision:   {incision:13716::"no dehiscence","incision well approximated","healing well","no drainage","no erythema","no hernia","no seroma","no swelling"}    Pathology:    Assessment:   Patient s/p XI ROBOTIC ASSISTED TOTAL LAPAROSCOPIC HYSTERECTOMY WITH BILATERAL SALPINGO OOPHORECTOMY (surgery)  {doing well:13525::"Doing well postoperatively."}   Plan:   1. Continue any current medications as instructed by provider. 2. Wound care discussed. 3. Operative findings again reviewed. Pathology report discussed. 4. Activity restrictions: {restrictions:13723} 5. Anticipated return to work: {work return:14002}. 6. Follow up: {8-28:00349} {time; units:18646} for ***    Hildred Laser, MD [facility]

## 2023-03-24 NOTE — Progress Notes (Signed)
Referral sent to [facility] per patient preference, their office will call her with the appt.   She will see Dr. Orlie Dakin tomorrow at 11;15.

## 2023-03-25 ENCOUNTER — Encounter: Payer: Self-pay | Admitting: Oncology

## 2023-03-25 ENCOUNTER — Inpatient Hospital Stay: Payer: BC Managed Care – PPO | Attending: Oncology | Admitting: Oncology

## 2023-03-25 ENCOUNTER — Ambulatory Visit (INDEPENDENT_AMBULATORY_CARE_PROVIDER_SITE_OTHER): Payer: BC Managed Care – PPO | Admitting: Obstetrics and Gynecology

## 2023-03-25 ENCOUNTER — Encounter: Payer: Self-pay | Admitting: Obstetrics and Gynecology

## 2023-03-25 ENCOUNTER — Encounter: Payer: Self-pay | Admitting: *Deleted

## 2023-03-25 ENCOUNTER — Inpatient Hospital Stay: Payer: BC Managed Care – PPO

## 2023-03-25 VITALS — BP 137/82 | HR 79 | Wt 140.8 lb

## 2023-03-25 VITALS — BP 131/74 | HR 78 | Temp 96.6°F | Resp 16 | Ht 63.0 in | Wt 140.0 lb

## 2023-03-25 DIAGNOSIS — Z1509 Genetic susceptibility to other malignant neoplasm: Secondary | ICD-10-CM | POA: Diagnosis not present

## 2023-03-25 DIAGNOSIS — Z90710 Acquired absence of both cervix and uterus: Secondary | ICD-10-CM | POA: Diagnosis not present

## 2023-03-25 DIAGNOSIS — D0512 Intraductal carcinoma in situ of left breast: Secondary | ICD-10-CM | POA: Diagnosis not present

## 2023-03-25 DIAGNOSIS — F17210 Nicotine dependence, cigarettes, uncomplicated: Secondary | ICD-10-CM | POA: Insufficient documentation

## 2023-03-25 DIAGNOSIS — Z1501 Genetic susceptibility to malignant neoplasm of breast: Secondary | ICD-10-CM | POA: Diagnosis not present

## 2023-03-25 DIAGNOSIS — N80209 Endometriosis of unspecified fallopian tube, unspecified depth: Secondary | ICD-10-CM

## 2023-03-25 DIAGNOSIS — E8940 Asymptomatic postprocedural ovarian failure: Secondary | ICD-10-CM

## 2023-03-25 DIAGNOSIS — Z4889 Encounter for other specified surgical aftercare: Secondary | ICD-10-CM

## 2023-03-25 MED ORDER — ESTROGENS CONJUGATED 0.625 MG PO TABS: 0.6250 mg | ORAL_TABLET | Freq: Every day | ORAL | 3 refills | Status: AC

## 2023-03-25 NOTE — Progress Notes (Signed)
Accompanied patient and family to initial medical oncology appointment.   Reviewed Breast Cancer treatment handbook.   Care plan summary given to patient.   Reviewed outreach programs and cancer center services.   

## 2023-03-25 NOTE — Progress Notes (Signed)
Patient presents for post-op visit. Is 6 weeks s/p laparoscopic hysterectomy.

## 2023-03-25 NOTE — Progress Notes (Signed)
[facility]  [phone] Fax:([phone]  ID: Mara Favero OB: [birth year]  MR#: [id number]  QIO#:[id number]  Patient Care Team: Patient, No Pcp Per as PCP - General (General Practice) Nadara Mustard, MD as Referring Physician (Obstetrics and Gynecology) Lemar Livings, Merrily Pew, MD (General Surgery) Hulen Luster, RN as Oncology Nurse Navigator  CHIEF COMPLAINT: DCIS left breast.  INTERVAL HISTORY: Patient is a 47 year old female with known history of ATM gene mutation who recently underwent total hysterectomy.  Routine screening mammogram revealed an abnormality and subsequent biopsy revealed the above-stated pathology.  She is anxious, but otherwise feels well.  She has no neurologic complaints.  She denies any recent fevers or illnesses.  She has a good appetite and denies weight loss.  She has no chest pain, shortness of breath, cough, or hemoptysis.  She denies any nausea, vomiting, constipation, or diarrhea.  She has no urinary complaints.  Patient offers no further specific complaints today.  REVIEW OF SYSTEMS:   Review of Systems  Constitutional: Negative.  Negative for fever, malaise/fatigue and weight loss.  Respiratory: Negative.  Negative for cough, hemoptysis and shortness of breath.   Cardiovascular: Negative.  Negative for chest pain and leg swelling.  Gastrointestinal: Negative.  Negative for abdominal pain.  Genitourinary: Negative.  Negative for dysuria.  Musculoskeletal: Negative.  Negative for back pain.  Skin: Negative.  Negative for rash.  Neurological: Negative.  Negative for dizziness, focal weakness, weakness and headaches.  Psychiatric/Behavioral:  The patient is nervous/anxious.     As per HPI. Otherwise, a complete review of systems is negative.  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   MyRisk testing; (increased risk of breast/pancreatic)   Ovarian cyst     PAST SURGICAL HISTORY: Past Surgical History:  Procedure Laterality Date   ARTHROSCOPY WITH ANTERIOR CRUCIATE LIGAMENT (ACL) REPAIR WITH ANTERIOR TIBILIAS GRAFT Left 03/10/2015   Procedure: LEFT KNEE ARTHROSCOPY MEDIAL MENISCECTOMY  ANTERIOR CRUCIATE LIGAMENT (ACL) TIBIAL ANTERIOR ALLOGRAFT;  Surgeon: Teryl Lucy, MD;  Location: [facility];  Service: Orthopedics;  Laterality: Left;   BREAST BIOPSY Left 03/20/2023   Stereo bx, X-clip, path pending   BREAST BIOPSY Left 03/20/2023   MM LT BREAST BX W LOC DEV 1ST LESION IMAGE BX SPEC STEREO GUIDE 03/20/2023 [facility]   KNEE ARTHROSCOPY WITH MEDIAL MENISECTOMY Left 03/10/2015   Procedure: KNEE ARTHROSCOPY WITH MEDIAL MENISECTOMY;  Surgeon: Teryl Lucy, MD;  Location: [facility];  Service: Orthopedics;  Laterality: Left;   ROBOTIC ASSISTED TOTAL HYSTERECTOMY WITH BILATERAL SALPINGO OOPHERECTOMY Bilateral 02/10/2023   Procedure: XI ROBOTIC ASSISTED TOTAL LAPAROSCOPIC HYSTERECTOMY WITH BILATERAL SALPINGO OOPHORECTOMY;  Surgeon: Hildred Laser, MD;  Location: [facility];  Service: Gynecology;  Laterality: Bilateral;    FAMILY HISTORY: Family History  Adopted: Yes  Problem Relation Age of Onset   Alcohol abuse Maternal Uncle    Alcohol abuse Paternal Uncle    Arthritis Maternal Grandmother    Heart disease Maternal Grandmother    Anxiety disorder Maternal Grandmother    Rheum  arthritis Paternal Uncle    Arthritis Mother    Hypertension Mother    Anxiety disorder Mother    Arthritis Father    Hyperlipidemia Father    Hypertension Father    Uterine cancer Maternal Aunt     Hyperlipidemia Maternal Grandfather    Breast cancer Maternal Grandfather    Anxiety disorder Maternal Aunt    Ovarian cancer Maternal Aunt     ADVANCED DIRECTIVES (Y/N):  N  HEALTH MAINTENANCE: Social History   Tobacco Use   Smoking status: Every Day    Packs/day: 0.50    Years: 14.00    Additional pack years: 0.00    Total pack years: 7.00    Types: Cigarettes   Smokeless tobacco: Never  Vaping Use   Vaping Use: Never used  Substance Use Topics   Alcohol use: Yes    Alcohol/week: 3.0 standard drinks of alcohol    Types: 3 Glasses of wine per week    Comment: occasionally   Drug use: No     Colonoscopy:  PAP:  Bone density:  Lipid panel:  No Known Allergies  Current Outpatient Medications  Medication Sig Dispense Refill   docusate sodium (COLACE) 100 MG capsule Take 1 capsule (100 mg total) by mouth 2 (two) times daily as needed. 30 capsule 2   estradiol (CLIMARA - DOSED IN MG/24 HR) 0.05 mg/24hr patch Place 1 patch (0.05 mg total) onto the skin once a week. 4 patch 12   ibuprofen (ADVIL) 600 MG tablet Take 1 tablet (600 mg total) by mouth every 6 (six) hours as needed. 60 tablet 3   ketorolac (TORADOL) 10 MG tablet Take 1 tablet (10 mg total) by mouth every 6 (six) hours as needed. 20 tablet 0   ondansetron (ZOFRAN) 4 MG tablet Take 1 tablet (4 mg total) by mouth every 8 (eight) hours as needed for nausea or vomiting. 20 tablet 1   simethicone (GAS-X) 80 MG chewable tablet Chew 1 tablet (80 mg total) by mouth 4 (four) times daily as needed for flatulence. 30 tablet 2   ZUBSOLV 2.9-0.71 MG SUBL Take 1.5 tablets by mouth daily.     No current facility-administered medications for this visit.    OBJECTIVE: Vitals:   03/25/23 1119  BP: 131/74  Pulse: 78  Resp: 16  Temp: (!) 96.6 F (35.9 C)  SpO2: 98%     Body mass index is 24.8 kg/m.    ECOG FS:0 - Asymptomatic  General: Well-developed, well-nourished, no acute distress. Eyes: Pink conjunctiva, anicteric  sclera. HEENT: Normocephalic, moist mucous membranes. Breast: Exam deferred today. Lungs: No audible wheezing or coughing. Heart: Regular rate and rhythm. Abdomen: Soft, nontender, no obvious distention. Musculoskeletal: No edema, cyanosis, or clubbing. Neuro: Alert, answering all questions appropriately. Cranial nerves grossly intact. Skin: No rashes or petechiae noted. Psych: Normal affect. Lymphatics: No cervical, calvicular, axillary or inguinal LAD.   LAB RESULTS:  Lab Results  Component Value Date   NA 138 02/10/2023   K 4.0 02/10/2023   CL 105 02/10/2023   CO2 24 11/18/2022   GLUCOSE 98 02/10/2023   BUN 9 02/10/2023   CREATININE 0.60 02/10/2023   CALCIUM 9.2 11/18/2022   PROT 7.6 11/18/2022   ALBUMIN 4.1 11/18/2022   AST 15 11/18/2022   ALT 9 11/18/2022   ALKPHOS 71 11/18/2022   BILITOT 0.5 11/18/2022   GFRNONAA >60 11/18/2022   GFRAA >60 11/04/2019    Lab Results  Component Value Date   WBC 5.9 02/05/2023  NEUTROABS 3.8 11/19/2022   HGB 13.3 02/10/2023   HCT 39.0 02/10/2023   MCV 79.1 (L) 02/05/2023   PLT 220 02/05/2023     STUDIES: MM LT BREAST BX W LOC DEV 1ST LESION IMAGE BX SPEC STEREO GUIDE  Addendum Date: 03/24/2023   ADDENDUM REPORT: 03/24/2023 11:50 ADDENDUM: PATHOLOGY revealed: A. 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 Comment: Ductal carcinoma in situ is present in 5 of 7 sections and spans an area of approximately 8 mm. Pathology results are CONCORDANT with imaging findings, per Dr. Meda Klinefelter. Pathology results and recommendations were discussed with patient via telephone on 03/21/2023. Patient reported biopsy site doing well with no adverse symptoms, and only slight tenderness at the site. Post biopsy care instructions were reviewed, questions were answered and my direct phone number was provided. Patient was instructed to call [facility]  for any additional questions or concerns related to biopsy site. RECOMMENDATIONS: 1. Surgical and oncological consultation. Request for surgical and oncological consultation relayed to Irving Shows RN at [facility] by Randa Lynn RN on 03/21/2023. 2. Recommend pretreatment bilateral breast MRI with and without contrast to determine extent of breast disease given diagnosis of high grade DCIS and young age. Pathology results reported by Randa Lynn RN on 03/24/2023. Electronically Signed   By: Meda Klinefelter M.D.   On: 03/24/2023 11:50   Result Date: 03/24/2023 CLINICAL DATA:  Indeterminate LEFT breast calcifications EXAM: LEFT BREAST STEREOTACTIC CORE NEEDLE BIOPSY COMPARISON:  Previous exam(s). FINDINGS: The patient and I discussed the procedure of stereotactic-guided biopsy including benefits and alternatives. We discussed the high likelihood of a successful procedure. We discussed the risks of the procedure including infection, bleeding, tissue injury, clip migration, and inadequate sampling. Informed written consent was given. The usual time out protocol was performed immediately prior to the procedure. Using sterile technique and 1% lidocaine and 1% lidocaine with epinephrine as local anesthetic, under stereotactic guidance, a 9 gauge vacuum assisted device was used to perform core needle biopsy of calcifications of the central LEFT breast using a superior approach. Specimen radiograph was performed showing representative calcifications. Specimens with calcifications are identified for pathology. Lesion quadrant: Upper outer quadrant At the conclusion of the procedure, an X shaped tissue marker clip was deployed into the biopsy cavity. Follow-up 2-view mammogram was performed and dictated separately. IMPRESSION: Stereotactic-guided biopsy of indeterminate calcifications. No apparent complications. Electronically Signed: By: Meda Klinefelter M.D. On: 03/20/2023 13:49   MM CLIP PLACEMENT  LEFT  Result Date: 03/20/2023 CLINICAL DATA:  Status post stereotactic guided biopsy of calcifications. EXAM: 3D DIAGNOSTIC LEFT MAMMOGRAM POST STEREOTACTIC BIOPSY COMPARISON:  Previous exam(s). FINDINGS: 3D Mammographic images were obtained following stereotactic guided biopsy of calcifications. The X shaped biopsy marking clip is in expected position at the site of biopsy. There are residual calcifications extending anterior, posterior and lateral to the site of biopsy. IMPRESSION: Appropriate positioning of the X shaped biopsy marking clip at the site of biopsy in the central breast at middle depth. Final Assessment: Post Procedure Mammograms for Marker Placement Electronically Signed   By: Meda Klinefelter M.D.   On: 03/20/2023 13:47  MM 3D DIAGNOSTIC MAMMOGRAM BILATERAL BREAST  Result Date: 03/13/2023 CLINICAL DATA:  47 year old female presenting for screening recall for possible bilateral asymmetries and left breast calcifications EXAM: DIGITAL DIAGNOSTIC BILATERAL MAMMOGRAM WITH TOMOSYNTHESIS; ULTRASOUND LEFT BREAST LIMITED TECHNIQUE: Bilateral digital diagnostic mammography and breast tomosynthesis was performed.; Targeted ultrasound examination  of the left breast was performed. COMPARISON:  Previous exam(s). ACR Breast Density Category c: The breasts are heterogeneously dense, which may obscure small masses. FINDINGS: Right: The previously described possible asymmetry in the central right breast does not persist with additional views, consistent with superimposed fibroglandular tissue. No suspicious mass, microcalcification, or other finding is identified. Left: Spot magnification views demonstrate a 2.0 cm group of pleomorphic calcification in the central left breast middle depth. This corresponds with the area of screening mammogram concern. On spot tomosynthesis views, there is possible subtle associated architectural distortion (spot CC image 32/56), which corresponds with the questioned  asymmetry on screening mammogram. Targeted left breast ultrasound was performed by the sonographer and the physician at 12 o'clock 4 cm from the nipple. This demonstrates normal fibroglandular tissue. No sonographic correlate for the possible subtle architectural distortion is identified. IMPRESSION: 1. Left breast 2.0 cm group of pleomorphic calcification in the central position middle depth is intermediate suspicion for malignancy. Of note, there is possible associated subtle architectural distortion without a sonographic correlate. Recommend further assessment with stereotactic guided biopsy. 2. The previously described possible asymmetry in the central right breast does not persist with additional views, consistent with superimposed fibroglandular tissue. No mammographic evidence of malignancy in the right breast. RECOMMENDATION: Left breast stereotactic guided biopsy (1 site). I have discussed the findings and recommendations with the patient. The biopsy procedure was discussed with the patient and questions were answered. Patient expressed their understanding of the biopsy recommendation. Patient will be scheduled for biopsy at her earliest convenience by the schedulers. Ordering provider will be notified. If applicable, a reminder letter will be sent to the patient regarding the next appointment. BI-RADS CATEGORY  4: Suspicious. Electronically Signed   By: Jacob Moores M.D.   On: 03/13/2023 16:25  [place] LIMITED ULTRASOUND INCLUDING AXILLA LEFT BREAST   Result Date: 03/13/2023 CLINICAL DATA:  48 year old female presenting for screening recall for possible bilateral asymmetries and left breast calcifications EXAM: DIGITAL DIAGNOSTIC BILATERAL MAMMOGRAM WITH TOMOSYNTHESIS; ULTRASOUND LEFT BREAST LIMITED TECHNIQUE: Bilateral digital diagnostic mammography and breast tomosynthesis was performed.; Targeted ultrasound examination of the left breast was performed. COMPARISON:  Previous exam(s). ACR Breast Density  Category c: The breasts are heterogeneously dense, which may obscure small masses. FINDINGS: Right: The previously described possible asymmetry in the central right breast does not persist with additional views, consistent with superimposed fibroglandular tissue. No suspicious mass, microcalcification, or other finding is identified. Left: Spot magnification views demonstrate a 2.0 cm group of pleomorphic calcification in the central left breast middle depth. This corresponds with the area of screening mammogram concern. On spot tomosynthesis views, there is possible subtle associated architectural distortion (spot CC image 32/56), which corresponds with the questioned asymmetry on screening mammogram. Targeted left breast ultrasound was performed by the sonographer and the physician at 12 o'clock 4 cm from the nipple. This demonstrates normal fibroglandular tissue. No sonographic correlate for the possible subtle architectural distortion is identified. IMPRESSION: 1. Left breast 2.0 cm group of pleomorphic calcification in the central position middle depth is intermediate suspicion for malignancy. Of note, there is possible associated subtle architectural distortion without a sonographic correlate. Recommend further assessment with stereotactic guided biopsy. 2. The previously described possible asymmetry in the central right breast does not persist with additional views, consistent with superimposed fibroglandular tissue. No mammographic evidence of malignancy in the right breast. RECOMMENDATION: Left breast stereotactic guided biopsy (1 site). I have discussed the findings and recommendations with the patient. The biopsy  procedure was discussed with the patient and questions were answered. Patient expressed their understanding of the biopsy recommendation. Patient will be scheduled for biopsy at her earliest convenience by the schedulers. Ordering provider will be notified. If applicable, a reminder letter will  be sent to the patient regarding the next appointment. BI-RADS CATEGORY  4: Suspicious. Electronically Signed   By: Jacob Moores M.D.   On: 03/13/2023 16:25  MM 3D SCREEN BREAST BILATERAL  Result Date: 03/06/2023 CLINICAL DATA:  Screening. EXAM: DIGITAL SCREENING BILATERAL MAMMOGRAM WITH TOMOSYNTHESIS AND CAD TECHNIQUE: Bilateral screening digital craniocaudal and mediolateral oblique mammograms were obtained. Bilateral screening digital breast tomosynthesis was performed. The images were evaluated with computer-aided detection. COMPARISON:  Previous exam(s). ACR Breast Density Category c: The breasts are heterogeneously dense, which may obscure small masses. FINDINGS: In the right breast, a possible asymmetry warrants further evaluation. In the left breast, a possible asymmetry and calcifications warrant further evaluation. IMPRESSION: Further evaluation is suggested for possible asymmetry in the right breast. Further evaluation is suggested for possible asymmetry and calcifications in the left breast. RECOMMENDATION: Diagnostic mammogram and possibly ultrasound of both breasts. (Code:[id number]-[id number]) The patient will be contacted regarding the findings, and additional imaging will be scheduled. BI-RADS CATEGORY  0: Incomplete: Need additional imaging evaluation. Electronically Signed   By: Meda Klinefelter M.D.   On: 03/06/2023 16:27    ASSESSMENT: DCIS left breast.  PLAN:    DCIS left breast: Patient has an appointment with surgery tomorrow to discuss her options.  We discussed at length lumpectomy with radiation, simple mastectomy, or prophylactic bilateral mastectomy with reconstruction given her ATM mutation and increased risk of malignancy.  Patient is unclear which approach she intends to take and will further discuss with surgery tomorrow.  Given the noninvasive nature of her disease, she likely will not require chemotherapy.  She will benefit from prophylactic tamoxifen for minimum of 5 years at  the conclusion of her treatments.  Patient will follow-up 1 to 2 weeks after surgery to discuss her final pathology results and additional treatment planning. ATM gene mutation: Patient was given a referral to genetics to discuss her risk and prophylactic options.  I spent a total of 60 minutes reviewing chart data, face-to-face evaluation with the patient, counseling and coordination of care as detailed above.   Patient expressed understanding and was in agreement with this plan. She also understands that She can call clinic at any time with any questions, concerns, or complaints.    Cancer Staging  Ductal carcinoma in situ (DCIS) of left breast Staging form: Breast, AJCC 8th Edition - Clinical stage from 03/25/2023: Stage 0 (cTis (DCIS), cN0, cM0) - Signed by Jeralyn Ruths, MD on 03/25/2023 Stage prefix: Initial diagnosis Nuclear grade: G3   Jeralyn Ruths, MD   03/25/2023 12:36 PM

## 2023-03-26 ENCOUNTER — Encounter: Payer: Self-pay | Admitting: Oncology

## 2023-03-26 ENCOUNTER — Encounter: Payer: Self-pay | Admitting: Licensed Clinical Social Worker

## 2023-03-26 ENCOUNTER — Ambulatory Visit: Payer: BC Managed Care – PPO | Admitting: Surgery

## 2023-03-26 ENCOUNTER — Inpatient Hospital Stay (HOSPITAL_BASED_OUTPATIENT_CLINIC_OR_DEPARTMENT_OTHER): Payer: BC Managed Care – PPO | Admitting: Licensed Clinical Social Worker

## 2023-03-26 ENCOUNTER — Encounter: Payer: Self-pay | Admitting: Surgery

## 2023-03-26 ENCOUNTER — Inpatient Hospital Stay: Payer: BC Managed Care – PPO

## 2023-03-26 VITALS — BP 135/90 | HR 83 | Temp 98.0°F | Ht 63.0 in | Wt 140.0 lb

## 2023-03-26 DIAGNOSIS — Z1501 Genetic susceptibility to malignant neoplasm of breast: Secondary | ICD-10-CM

## 2023-03-26 DIAGNOSIS — D0512 Intraductal carcinoma in situ of left breast: Secondary | ICD-10-CM

## 2023-03-26 DIAGNOSIS — Z8041 Family history of malignant neoplasm of ovary: Secondary | ICD-10-CM

## 2023-03-26 DIAGNOSIS — Z1509 Genetic susceptibility to other malignant neoplasm: Secondary | ICD-10-CM

## 2023-03-26 DIAGNOSIS — Z803 Family history of malignant neoplasm of breast: Secondary | ICD-10-CM | POA: Diagnosis not present

## 2023-03-26 DIAGNOSIS — Z1589 Genetic susceptibility to other disease: Secondary | ICD-10-CM | POA: Diagnosis not present

## 2023-03-26 NOTE — Progress Notes (Signed)
REFERRING PROVIDER: Jeralyn Ruths, MD [address] [place],  [place] [address]  PRIMARY PROVIDER:  Patient, No Pcp Per  PRIMARY REASON FOR VISIT:  1. Ductal carcinoma in situ (DCIS) of left breast   2. Monoallelic mutation of ATM gene   3. Family history of breast cancer   4. Family history of ovarian cancer      HISTORY OF PRESENT ILLNESS:   Ms. Debo, a 47 y.o. female, was seen for a [facility] cancer genetics consultation at the request of Dr. Orlie Dakin due to a personal and family history of cancer and her genetic testing from 2016 that showed an ATM mutation.  Ms. Mandarino presents to clinic today to discuss the possibility of a hereditary predisposition to cancer, genetic testing, and to further clarify her future cancer risks, as well as potential cancer risks for family members.   CANCER HISTORY:  In 2024, at the age of 72, Ms. Buehrer was diagnosed with DCIS of the left breast. The treatment plan is still being determined, patient is still deciding about her surgery. Ms. Shiffman had genetic testing through [facility] in 2016 that reportedly showed an ATM mutation. Patient does not have a copy of the report.   Oncology History  Ductal carcinoma in situ (DCIS) of left breast  03/25/2023 Initial Diagnosis   Ductal carcinoma in situ (DCIS) of left breast   03/25/2023 Cancer Staging   Staging form: Breast, AJCC 8th Edition - Clinical stage from 03/25/2023: Stage 0 (cTis (DCIS), cN0, cM0) - Signed by Jeralyn Ruths, MD on 03/25/2023 Stage prefix: Initial diagnosis Nuclear grade: G3     Past Medical History:  Diagnosis Date   ACL (anterior cruciate ligament) rupture 03/10/2014   ACL sprain 02/2015   left   BRCA negative 11/2015   [facility] BRCA/BART neg   Family history of ovarian cancer    H/O urinary retention    History of recurrent UTI (urinary tract infection)    Iron deficiency anemia    Medial meniscus tear 02/2015   left knee   Menorrhagia    Monoallelic  mutation of ATM gene 11/2015   [facility] testing; (increased risk of breast/pancreatic)   Ovarian cyst     Past Surgical History:  Procedure Laterality Date   ARTHROSCOPY WITH ANTERIOR CRUCIATE LIGAMENT (ACL) REPAIR WITH ANTERIOR TIBILIAS GRAFT Left 03/10/2015   Procedure: LEFT KNEE ARTHROSCOPY MEDIAL MENISCECTOMY  ANTERIOR CRUCIATE LIGAMENT (ACL) TIBIAL ANTERIOR ALLOGRAFT;  Surgeon: Teryl Lucy, MD;  Location: [facility];  Service: Orthopedics;  Laterality: Left;   BREAST BIOPSY Left 03/20/2023   Stereo bx, X-clip, path pending   BREAST BIOPSY Left 03/20/2023   MM LT BREAST BX W LOC DEV 1ST LESION IMAGE BX SPEC STEREO GUIDE 03/20/2023 [facility]   KNEE ARTHROSCOPY WITH MEDIAL MENISECTOMY Left 03/10/2015   Procedure: KNEE ARTHROSCOPY WITH MEDIAL MENISECTOMY;  Surgeon: Teryl Lucy, MD;  Location: [facility];  Service: Orthopedics;  Laterality: Left;   ROBOTIC ASSISTED TOTAL HYSTERECTOMY WITH BILATERAL SALPINGO OOPHERECTOMY Bilateral 02/10/2023   Procedure: XI ROBOTIC ASSISTED TOTAL LAPAROSCOPIC HYSTERECTOMY WITH BILATERAL SALPINGO OOPHORECTOMY;  Surgeon: Hildred Laser, MD;  Location: [facility];  Service: Gynecology;  Laterality: Bilateral;    FAMILY HISTORY:  We obtained a detailed, 4-generation family history.  Significant diagnoses are listed below: Family History  Adopted: Yes  Problem Relation Age of Onset   Arthritis Mother    Hypertension Mother    Anxiety disorder Mother    Arthritis Father  Hyperlipidemia Father    Hypertension Father    Anxiety disorder Maternal Aunt    Ovarian cancer Maternal Aunt        dx 84s   Alcohol abuse Maternal Uncle    Alcohol abuse Paternal Uncle    Rheum arthritis Paternal Uncle    Arthritis Maternal Grandmother    Heart disease Maternal Grandmother    Anxiety disorder Maternal Grandmother    Hyperlipidemia Maternal Grandfather    Lung cancer Maternal Grandfather    Breast cancer Other        paternal  great aunt   Ms. Card has 1 brother, 69, and 2 nieces, no cancers.  Ms. Groom's mother is living at 85 with no cancer history. A maternal aunt had ovarian cancer in her 64s and is living at 49. Maternal grandmother had lung cancer and died over age 41.   Ms. Shropshire's father is living at 34 and has had skin cancer. Paternal great aunt had breast cancer over age 73.   Ms. Sherman is aware of previous family history of genetic testing for hereditary cancer risks. There is no reported Ashkenazi Jewish ancestry. There is no known consanguinity.    GENETIC COUNSELING ASSESSMENT: Ms. Pangilinan is a 47 y.o. female with a new diagnosis of DCIS and previous genetic testing that showed an ATM mutation.  We, therefore, discussed and recommended the following at today's visit.   DISCUSSION: We discussed that approximately 10% of cancer is hereditary. We discussed the ATM gene in detail and management guidelines (listed below). Ms. Speights does not have a copy of her genetic test report. We discussed that since she can't find it and since there have been updates to genetic testing since 2016, she could consider testing again. This will likely just show the known ATM mutation, but there are other genes we can test now that would not have been included on her 2016 test. Re-testing her will also allow her family to have free testing through [facility]'s family follow up program. We discussed that testing is beneficial for several reasons including knowing about cancer risks, identifying potential screening and risk-reduction options that may be appropriate, and to understand if other family members could be at risk for cancer and allow them to undergo genetic testing.   We reviewed the characteristics, features and inheritance patterns of hereditary cancer syndromes. We also discussed genetic testing, including the appropriate family members to test, the process of testing, insurance coverage and turn-around-time  for results. We discussed the implications of a negative, positive and/or variant of uncertain significant result. We recommended Ms. Mexicano pursue genetic testing for the [facility] Hereditary Breast Cancer STAT + Multi-Cancer+RNA gene panel.   Based on Ms. Ryer's personal and family history of cancer, she meets medical criteria for genetic testing. Despite that she meets criteria, she may still have an out of pocket cost. We discussed that if her out of pocket cost for testing is over $100, the laboratory will call and confirm whether she wants to proceed with testing.  If the out of pocket cost of testing is less than $100 she will be billed by the genetic testing laboratory.   Cancer Risks for ATM: Women have a 20-30% lifetime risk of breast cancer. 2-3% risk for epithelial ovarian cancer 5-10% risk for pancreatic cancer  There is emerging evidence suggesting an increased risk for prostate cancer.  Research is continuing to help learn more about the cancers associated with ATM pathogenic variants and what the exact risks are  to develop these cancers.  Management Recommendations:  Breast Screening/Risk Reduction: Breast cancer screening includes: Breast awareness beginning at age 51 Monthly self-breast examination beginning at age 24 Clinical breast examination every 6-12 months beginning at age 43 or at the age of the earliest diagnosed breast cancer in the family, if onset was before age 75 Annual mammogram with consideration of tomosynthesis starting at age 47 or 10 years prior to the youngest age of diagnosis, whichever comes first Consider breast MRI with and without contrast starting at age 42-35 Consider additional risk reducing strategies such as Tamoxifen Evidence is insufficient for a prophylactic risk-reducing mastectomy, manage based on family history  For patients who are treated for breast cancer and have not had bilateral mastectomy, screening should continue as  described  Ovarian Cancer Screening/Risk Reduction: Evidence insufficient for risk-reducing salpingo oophorectomy; manage based on family history If there is a family history of ovarian cancer, have a discussion with your physician about the benefits and limitations of screening and risk reducing strategies  Pancreatic Cancer Screening/Risk Reduction: Avoid smoking, heavy alcohol use, and obesity. Pancreatic cancer screening may be considered in those with a family history of pancreatic cancer (first- or second-degree relative). Screening includes annual endoscopic ultrasound (preferred) and/or MRI of the pancreas starting at age 90 or 54 years younger than the earliest age diagnosis in the family.  Annual concurrent CA19-9 testing may also be considered.  Prostate Cancer Screening: Consider beginning annual PSA blood test and digital rectal exams at age 53.  Additional considerations: There is insufficient evidence to recommend against radiation therapy.  Individuals with a single pathogenic ATM variant are also carriers of ataxia telangiectasia. Ataxia telangiectasia is associated with childhood cancer risks as well as other medical problems (such as difficulty with movement, balance and coordination problems, neuropathy, and weakened immunity). For there to be a risk of ataxia telangiectasia in offspring, both the patient and their partner would each have to carry a pathogenic variant in ATM; in this case, the risk to have an affected child is 25%.  This information is based on current understanding of the gene and may change in the future.  Implications for Family Members: Hereditary predisposition to cancer due to pathogenic variants in the ATM gene has autosomal dominant inheritance. This means that an individual with a pathogenic variant has a 50% chance of passing the condition on to his/her offspring. Identification of a pathogenic variant allows for the recognition of at-risk relatives  who can pursue testing for the familial variant.   Family members are encouraged to consider genetic testing for this familial pathogenic variant. As there are generally no childhood cancer risks associated with pathogenic variants in the ATM gene, individuals in the family are not recommended to have testing until they reach at least 45 years of age.   PLAN: After considering the risks, benefits, and limitations, Ms. Sens provided informed consent to pursue genetic testing and the blood sample was sent to [facility] for analysis of the Multi-Cancer+RNA panel. Results should be available within approximately 1 weeks' time, at which point they will be disclosed by telephone to Ms. Bischoff, as will any additional recommendations warranted by these results. Ms. Dunivan will receive a summary of her genetic counseling visit and a copy of her results once available. This information will also be available in Epic.   Ms. Weldy's questions were answered to her satisfaction today. Our contact information was provided should additional questions or concerns arise. Thank you for the referral and  allowing [place] to share in the care of your patient.   Lacy Duverney, MS, [facility] Genetic Counselor [email or web address] Phone: [phone]  The patient was seen for a total of 35 minutes in face-to-face genetic counseling.  Patient was seen with her friend. Dr. Orlie Dakin was available for discussion regarding this case.   _______________________________________________________________________ For Office Staff:  Number of people involved in session: 3 Was an Intern/ student involved with case: yes - pharmacy resident Eber Jones was present.

## 2023-03-26 NOTE — Patient Instructions (Signed)
Call and let us know how you would like to proceed based on your discussion with genetic counseling.   Ductal Carcinoma In Situ  Ductal carcinoma in situ is the presence of abnormal cells in the breast. It is the earliest form of breast cancer. The abnormal cells are located only in the tubes that carry milk to the nipple (milk ducts) and have not spread to other areas. What are the causes? The exact cause of ductal carcinoma in situ is unknown. What increases the risk? The following factors increase the risk of developing ductal carcinoma in situ: Being older than 45 years of age. Being female. Having a family history of breast cancer. Starting menopause after age 4. Starting your menstrual periods before age 23. Having never been pregnant or having your first child after age 51. Having never breastfed. A personal history of: Breast cancer. Dense breast tissue. Radiation treatments to the breasts or chest area. Having the BRCA1 and BRCA2 genes. Having certain types of noncancerous (benign) breast conditions, such as non-proliferative lesions, proliferative lesions with or without atypia (cell abnormalities), or lobular carcinoma in situ. Exposure to the drug DES, which was given to pregnant women from the 1940s to the 1970s. Other risks include: Current or past hormone use, such as: Using birth control. Taking hormone therapy after menopause. Being overweight or obese after menopause. Having an inactive (sedentary) lifestyle. Drinking more than one alcoholic beverage a day. What are the signs or symptoms? Ductal carcinoma in situ does not cause any symptoms. How is this diagnosed? Ductal carcinoma in situ is usually discovered during a routine X-ray of the breasts to check for abnormal changes (mammogram). To diagnose the condition, your health care provider may do an ultrasound and remove a tissue sample from your breast so it can be examined under a microscope (breast  biopsy). Your health care provider may also remove one or more lymph nodes from under your arm to check if the abnormal cells have spread to your lymph nodes (sentinel lymph node biopsy). Lymph nodes are part of the body's disease-fighting system (immune system). They are located throughout the body. The lymph nodes under the arms are usually the first place where abnormal cells spread. How is this treated? Treatment for this condition may include: A lumpectomy. This is surgery to remove the area of abnormal cells, along with a ring of normal tissue. This may also be called breast-conserving surgery. Simple mastectomy. This is surgery to remove breast tissue, the nipple, and the circle of colored tissue around the nipple (areola). Sometimes, one or more lymph nodes from under the arm are also removed and tested for cancer cells. Preventive mastectomy. This is the removal of both breasts. This is usually done only if you have a very high risk of developing breast cancer. Radiation. This is the use of high-energy rays to kill cancer cells. Hormone therapy, which are medicines to keep the abnormal cells from spreading. Follow these instructions at home: Lifestyle Eat a healthy diet. A healthy diet includes lots of fruits and vegetables, low-fat dairy products, lean meats, and fiber. Make sure half your plate is filled with fruits or vegetables. Choose high-fiber foods such as whole-grain breads and cereals. Do not use any products that contain nicotine or tobacco. These products include cigarettes, chewing tobacco, and vaping devices, such as e-cigarettes. If you need help quitting, ask your health care provider. Alcohol use Do not drink alcohol if: Your health care provider tells you not to drink. You are pregnant,  may be pregnant, or are planning to become pregnant. If you drink alcohol: Limit how much you have to 0-1 drink a day. Know how much alcohol is in your drink. In the [place] one drink  equals one 12 oz bottle of beer (355 mL), one 5 oz glass of wine (148 mL), or one 1 oz glass of hard liquor (44 mL). General instructions Take over-the-counter and prescription medicines only as told by your health care provider. Keep all follow-up visits. This is important. Where to find more information American Cancer Society: www.cancer.org National Cancer Institute: www.cancer.gov Contact a health care provider if: You have a fever. You notice a new lump in either breast or under your arm. You have any symptoms or changes that concern you. You notice new fatigue or weakness. Get help right away if: You have chest pain or trouble breathing. These symptoms may be an emergency. Get help right away. Call 911. Do not wait to see if the symptoms will go away. Do not drive yourself to the hospital. Summary Ductal carcinoma in situ is the presence of abnormal cells in the breast. It is the earliest form of breast cancer. The exact cause of ductal carcinoma in situ is unknown. Among other factors, the risk increases with age and with current or past hormone use. To diagnose the condition, your health care provider will remove a tissue sample from your breast so it can be examined under a microscope (breast biopsy). This information is not intended to replace advice given to you by your health care provider. Make sure you discuss any questions you have with your health care provider. Document Revised: 08/30/2021 Document Reviewed: 08/30/2021 Elsevier Patient Education  2023 [employer].

## 2023-03-26 NOTE — Progress Notes (Signed)
03/26/2023  Reason for Visit:  Left breast DCIS  Requesting Provider:  Althea Grimmer, PA-C  History of Present Illness: Ann Cox is a 47 y.o. 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  She has recently undergone a robotic hysterectomy and bilateral salpingo-oophorectomy with Dr. Valentino Saxon on 02/10/23.  She had a mammogram on 03/06/23 which showed an asymmetry and calcifications in the left breast.  Diagnostic mammogram on 03/13/23 showed an area of 2 cm in size of pleomorphic calcifications in the central left breast at middle depth.  This was biopsied on 03/20/23 which resulted in DCIS with comedonecrosis.  She has seen Dr. Orlie Dakin and is meeting with Ms. Lacy Duverney this afternoon for genetic counseling.    The patient reports history of breast cancer in a paternal great aunt, but not at a young age.  Her menstrual cycle started at age 59, had 1 pregnancy which resulted in abortion.  In the past she has had an abscess in the left breast in same/similar area as the biopsy, and was seen by Dr. Lemar Livings back then.  He describes a cutaneous abscess and was treated with Doxycycline and warm compresses.  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   MyRisk testing; (increased risk of breast/pancreatic)   Ovarian cyst      Past Surgical History: Past Surgical History:  Procedure Laterality Date   ARTHROSCOPY WITH ANTERIOR CRUCIATE LIGAMENT (ACL) REPAIR WITH ANTERIOR TIBILIAS GRAFT Left 03/10/2015   Procedure: LEFT  KNEE ARTHROSCOPY MEDIAL MENISCECTOMY  ANTERIOR CRUCIATE LIGAMENT (ACL) TIBIAL ANTERIOR ALLOGRAFT;  Surgeon: Teryl Lucy, MD;  Location: [facility];  Service: Orthopedics;  Laterality: Left;   BREAST BIOPSY Left 03/20/2023   Stereo bx, X-clip, path pending   BREAST BIOPSY Left 03/20/2023   MM LT BREAST BX W LOC DEV 1ST LESION IMAGE BX SPEC STEREO GUIDE 03/20/2023 [facility]   KNEE ARTHROSCOPY WITH MEDIAL MENISECTOMY Left 03/10/2015   Procedure: KNEE ARTHROSCOPY WITH MEDIAL MENISECTOMY;  Surgeon: Teryl Lucy, MD;  Location: [facility];  Service: Orthopedics;  Laterality: Left;   ROBOTIC ASSISTED TOTAL HYSTERECTOMY WITH BILATERAL SALPINGO OOPHERECTOMY Bilateral 02/10/2023   Procedure: XI ROBOTIC ASSISTED TOTAL LAPAROSCOPIC HYSTERECTOMY WITH BILATERAL SALPINGO OOPHORECTOMY;  Surgeon: Hildred Laser, MD;  Location: [facility];  Service: Gynecology;  Laterality: Bilateral;    Home Medications: Prior to Admission medications   Medication Sig Start Date End Date Taking? Authorizing Provider  docusate sodium (COLACE) 100 MG capsule Take 1 capsule (100 mg total) by mouth 2 (two) times daily as needed. 02/10/23  Yes Hildred Laser, MD  estrogens, conjugated, (PREMARIN) 0.625 MG tablet Take 1 tablet (0.625 mg total) by mouth daily. Take daily for 21 days then do not take for 7 days. 03/25/23  Yes Hildred Laser, MD  ibuprofen (ADVIL) 600 MG tablet Take 1 tablet (600 mg total) by mouth every 6 (six) hours as needed. 02/10/23  Yes Hildred Laser, MD  ketorolac (  TORADOL) 10 MG tablet Take 1 tablet (10 mg total) by mouth every 6 (six) hours as needed. 02/11/23  Yes Hildred Laser, MD  ondansetron (ZOFRAN) 4 MG tablet Take 1 tablet (4 mg total) by mouth every 8 (eight) hours as needed for nausea or vomiting. 02/11/23  Yes Hildred Laser, MD  simethicone (GAS-X) 80 MG chewable tablet Chew 1 tablet (80 mg total) by mouth 4 (four) times daily as needed for flatulence. 02/10/23 02/10/24 Yes Hildred Laser, MD  ZUBSOLV 2.9-0.71 MG SUBL Take 1.5 tablets by mouth daily. 07/27/19  Yes [provider]    Allergies: No Known Allergies  Social History:  reports that she has been smoking cigarettes. She has a 7.00 pack-year smoking history. She has been exposed to tobacco smoke. She has never used smokeless tobacco. She reports current alcohol use of about 3.0 standard drinks of alcohol per week. She reports that she does not use drugs.   Family History: Family History  Adopted: Yes  Problem Relation Age of Onset   Arthritis Mother    Hypertension Mother    Anxiety disorder Mother    Arthritis Father    Hyperlipidemia Father    Hypertension Father    Anxiety disorder Maternal Aunt    Ovarian cancer Maternal Aunt        dx 36s   Alcohol abuse Maternal Uncle    Alcohol abuse Paternal Uncle    Rheum arthritis Paternal Uncle    Arthritis Maternal Grandmother    Heart disease Maternal Grandmother    Anxiety disorder Maternal Grandmother    Hyperlipidemia Maternal Grandfather    Lung cancer Maternal Grandfather    Breast cancer Other        paternal great aunt    Review of Systems: Review of Systems  Constitutional:  Negative for chills and fever.  HENT:  Negative for hearing loss.   Respiratory:  Negative for cough.   Cardiovascular:  Negative for chest pain.  Gastrointestinal:  Negative for abdominal pain, nausea and vomiting.  Genitourinary:  Negative for dysuria.  Musculoskeletal:  Negative for myalgias.  Skin:  Negative for rash.  Neurological:  Negative for dizziness.  Psychiatric/Behavioral:  Negative for depression.     Physical Exam BP (!) 135/90   Pulse 83   Temp 98 F (36.7 C)   Ht  (1.6 m)   Wt 140 lb (63.5 kg)   LMP 12/15/2021 (Approximate)   SpO2 98%   BMI 24.80 kg/m  CONSTITUTIONAL: No acute distress, well nourished. HEENT:  Normocephalic, atraumatic, extraocular motion intact. NECK: Trachea is midline, and there is no jugular venous  distension.  RESPIRATORY:  Lungs are clear, and breath sounds are equal bilaterally. Normal respiratory effort without pathologic use of accessory muscles. CARDIOVASCULAR: Heart is regular without murmurs, gallops, or rubs. BREAST:  Left breast s/p biopsy at 12 o'clock with steri strips still in place.  No palpable masses, skin changes, or nipple changes.  No left axillary lymphadenopathy.  Right breast without any palpable masses, skin changes, or nipple changes.  No right axillary lymphadenopathy. MUSCULOSKELETAL:  Normal muscle strength and tone in all four extremities.  No peripheral edema or cyanosis. SKIN: Skin turgor is normal. There are no pathologic skin lesions.  NEUROLOGIC:  Motor and sensation is grossly normal.  Cranial nerves are grossly intact. PSYCH:  Alert and oriented to person, place and time. Affect is normal.  Laboratory Analysis: Labs from 02/10/23: Na 138, K 4.0, Cl 105, CO2 22, BUN 9, Cr  0.6.  Hgb 13.3, Hct 39.  Left breast biopsy on 03/20/23: DIAGNOSIS:  A. 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   Imaging: Mammogram and U/S on 03/13/23: FINDINGS: Right: The previously described possible asymmetry in the central right breast does not persist with additional views, consistent with superimposed fibroglandular tissue. No suspicious mass, microcalcification, or other finding is identified.   Left: Spot magnification views demonstrate a 2.0 cm group of pleomorphic calcification in the central left breast middle depth. This corresponds with the area of screening mammogram concern. On spot tomosynthesis views, there is possible subtle associated architectural distortion (spot CC image 32/56), which corresponds with the questioned asymmetry on screening mammogram.   Targeted left breast ultrasound was performed by the sonographer and the physician at 12 o'clock 4 cm from the nipple.  This demonstrates normal fibroglandular tissue. No sonographic correlate for the possible subtle architectural distortion is identified.   IMPRESSION: 1. Left breast 2.0 cm group of pleomorphic calcification in the central position middle depth is intermediate suspicion for malignancy. Of note, there is possible associated subtle architectural distortion without a sonographic correlate. Recommend further assessment with stereotactic guided biopsy. 2. The previously described possible asymmetry in the central right breast does not persist with additional views, consistent with superimposed fibroglandular tissue. No mammographic evidence of malignancy in the right breast.   RECOMMENDATION: Left breast stereotactic guided biopsy (1 site).   BI-RADS CATEGORY  4: Suspicious.  Assessment and Plan: This is a 47 y.o. female with left breast DCIS.  --Discussed with the patient the findings on her mammogram and biopsy.  Overall she has DCIS and typically the recommendation for management would be in the form of lumpectomy followed by radiation and hormonal/endocrine therapy.  Her ATM mutation does increase her risk of breast cancer in her lifetime.  Discussed with her the other options for left breast mastectomy with sentinel lymph node biopsy, and also the addition of a prophylactic right mastectomy with bilateral breast reconstruction.  If she wishes to have reconstruction, we would also send a referral to Dr. Ulice Bold with Plastic Surgery.  Also discussed the differences with hospital stay and possible drains depending on which surgery she were to choose.  If only lumpectomy, she would still be at risk of breast cancer in either breast, and she would need yearly mammogram and most likely yearly MRI as well.  She's currently unsure of which option to take. --She has an appointment this afternoon with genetic counseling.  Discussed with her that since her test was back in 2016, new testing may be offered.   The patient wishes to discuss what her numbers would be as far as risk of breast cancer.  This will help her decide which surgery to undergo. --Patient will update [place] and contact us when she's decided so we can schedule her for surgery vs sending referrals.  I spent 40 minutes dedicated to the care of this patient on the date of this encounter to include pre-visit review of records, face-to-face time with the patient discussing diagnosis and management, and any post-visit coordination of care.   Howie Ill, MD [facility]

## 2023-03-28 ENCOUNTER — Encounter: Payer: Self-pay | Admitting: Surgery

## 2023-03-28 ENCOUNTER — Ambulatory Visit (INDEPENDENT_AMBULATORY_CARE_PROVIDER_SITE_OTHER): Payer: BC Managed Care – PPO | Admitting: Surgery

## 2023-03-28 VITALS — BP 131/85 | HR 81 | Temp 98.7°F | Ht 63.0 in | Wt 139.6 lb

## 2023-03-28 DIAGNOSIS — D0512 Intraductal carcinoma in situ of left breast: Secondary | ICD-10-CM | POA: Diagnosis not present

## 2023-03-28 NOTE — Progress Notes (Signed)
03/28/2023  History of Present Illness: Ann Cox is a 47 y.o. female presenting for follow up of left breast DCIS.  She had an appointment with Ms. Cowan on 03/26/23 and called our office with decision to proceed with lumpectomy alone.  We had previously discussed options for lumpectomy, mastectomy with SLNBx, or bilateral mastectomies with reconstruction.  The patient denies any new symptoms or issues.  She wants to proceed with lumpectomy alone, followed by radiation.  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   MyRisk testing; (increased risk of breast/pancreatic)   Ovarian cyst      Past Surgical History: Past Surgical History:  Procedure Laterality Date   ARTHROSCOPY WITH ANTERIOR CRUCIATE LIGAMENT (ACL) REPAIR WITH ANTERIOR TIBILIAS GRAFT Left 03/10/2015   Procedure: LEFT KNEE ARTHROSCOPY MEDIAL MENISCECTOMY  ANTERIOR CRUCIATE LIGAMENT (ACL) TIBIAL ANTERIOR ALLOGRAFT;  Surgeon: Teryl Lucy, MD;  Location: [facility];  Service: Orthopedics;  Laterality: Left;   BREAST BIOPSY Left 03/20/2023   Stereo bx, X-clip, path pending   BREAST BIOPSY Left 03/20/2023   MM LT BREAST BX W LOC DEV 1ST LESION IMAGE BX SPEC STEREO GUIDE 03/20/2023 [facility]   KNEE ARTHROSCOPY WITH MEDIAL MENISECTOMY Left 03/10/2015   Procedure: KNEE ARTHROSCOPY WITH MEDIAL MENISECTOMY;  Surgeon: Teryl Lucy, MD;  Location: [facility];  Service: Orthopedics;  Laterality: Left;   ROBOTIC ASSISTED TOTAL HYSTERECTOMY WITH BILATERAL SALPINGO OOPHERECTOMY Bilateral 02/10/2023   Procedure:  XI ROBOTIC ASSISTED TOTAL LAPAROSCOPIC HYSTERECTOMY WITH BILATERAL SALPINGO OOPHORECTOMY;  Surgeon: Hildred Laser, MD;  Location: [facility];  Service: Gynecology;  Laterality: Bilateral;    Home Medications: Prior to Admission medications   Medication Sig Start Date End Date Taking? Authorizing Provider  docusate sodium (COLACE) 100 MG capsule Take 1 capsule (100 mg total) by mouth 2 (two) times daily as needed. 02/10/23  Yes Hildred Laser, MD  estrogens, conjugated, (PREMARIN) 0.625 MG tablet Take 1 tablet (0.625 mg total) by mouth daily. Take daily for 21 days then do not take for 7 days. 03/25/23  Yes Hildred Laser, MD  ibuprofen (ADVIL) 600 MG tablet Take 1 tablet (600 mg total) by mouth every 6 (six) hours as needed. 02/10/23  Yes Hildred Laser, MD  ketorolac (TORADOL) 10 MG tablet Take 1 tablet (10 mg total) by mouth every 6 (six) hours as needed. 02/11/23  Yes Hildred Laser, MD  ondansetron (ZOFRAN) 4 MG tablet Take 1 tablet (4 mg total) by mouth every 8 (eight) hours as needed for nausea or vomiting. 02/11/23  Yes Hildred Laser, MD  simethicone (GAS-X) 80 MG chewable tablet Chew 1 tablet (80 mg total) by mouth 4 (four) times daily as needed for flatulence. 02/10/23 02/10/24 Yes Hildred Laser, MD  ZUBSOLV 2.9-0.71 MG SUBL Take 1.5 tablets by mouth daily. 07/27/19  Yes [provider]    Allergies: No Known Allergies  Review of Systems: Review of Systems  Constitutional:  Negative for chills and fever.  Respiratory:  Negative for shortness of breath.   Cardiovascular:  Negative for chest pain.  Gastrointestinal:  Negative for abdominal pain, nausea and vomiting.    Physical Exam BP 131/85   Pulse 81   Temp 98.7 F (37.1 C) (Oral)   Ht  (1.6 m)   Wt 139 lb 9.6 oz (63.3 kg)   LMP 12/15/2021 (Approximate)   SpO2 96%   BMI 24.73 kg/m  CONSTITUTIONAL: No acute distress, well nourished. HEENT:  Normocephalic, atraumatic, extraocular motion intact. RESPIRATORY:  Normal  respiratory effort without pathologic use of accessory muscles. CARDIOVASCULAR: Regular rhythm and rate. BREAST:  Exam deferred today as I examined her two days ago. NEUROLOGIC:  Motor and sensation is grossly normal.  Cranial nerves are grossly intact. PSYCH:  Alert and oriented to person, place and time. Affect is normal.  Laboratory Analysis: Labs from 02/10/23: Na 138, K 4.0, Cl 105, CO2 22, BUN 9, Cr 0.6.  Hgb 13.3, Hct 39.   Left breast biopsy on 03/20/23: DIAGNOSIS:  A. 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    Imaging: Mammogram and U/S on 03/13/23: FINDINGS: Right: The previously described possible asymmetry in the central right breast does not persist with additional views, consistent with superimposed fibroglandular tissue. No suspicious mass, microcalcification, or other finding is identified.   Left: Spot magnification views demonstrate a 2.0 cm group of pleomorphic calcification in the central left breast middle depth. This corresponds with the area of screening mammogram concern. On spot tomosynthesis views, there is possible subtle associated architectural distortion (spot CC image 32/56), which corresponds with the questioned asymmetry on screening mammogram.   Targeted left breast ultrasound was performed by the sonographer and the physician at 12 o'clock 4 cm from the nipple. This demonstrates normal fibroglandular tissue. No sonographic correlate for the possible subtle architectural distortion is identified.   IMPRESSION: 1. Left breast 2.0 cm group of pleomorphic calcification in the central position middle depth is intermediate suspicion for malignancy. Of note, there is possible associated subtle architectural distortion without a sonographic correlate. Recommend further assessment with stereotactic guided biopsy. 2. The previously described possible asymmetry in the central  right breast does not persist with additional views, consistent with superimposed fibroglandular tissue. No mammographic evidence of malignancy in the right breast.   RECOMMENDATION: Left breast stereotactic guided biopsy (1 site).   BI-RADS CATEGORY  4: Suspicious.   Assessment and Plan: This is a 47 y.o. female with left breast DCIS  --The patient has decided to proceed with a lumpectomy as treatment for her left breast DCIS.  Discussed with her that this is definitely an acceptable treatment plan.  Discussed with her the steps going forward with first RF tag placement at [facility] for localization of the area of calcifications in the left breast, followed by day of surgery lumpectomy.  Reviewed the surgery at length with her including the planned incision based on localization of the tag, risks of bleeding, infection, injury to surrounding structures, intra-op imaging of the specimen and consultation with pathology for gross margin evaluation, that this would be an outpatient surgery, post-op activity restrictions, pain control, and she's willing to proceed. --Discussed with her that after surgery, she would need radiation and we would send a referral to Dr. Rushie Chestnut post-op.  She would also need yearly mammograms and breast MRI given her high risk. --Patient understands this plan and all of her questions have been answered. --Will schedule her for surgery on 04/10/23.  I spent 30 minutes dedicated to the care of  this patient on the date of this encounter to include pre-visit review of records, face-to-face time with the patient discussing diagnosis and management, and any post-visit coordination of care.   Howie Ill, MD [facility]

## 2023-03-28 NOTE — Patient Instructions (Signed)
Our surgery scheduler Barbara will call you within 24-48 hours to get you scheduled. If you have not heard from her after 48 hours, please call our office. Have the blue sheet available when she calls to write down important information.  If you have any concerns or questions, please feel free to call our office.   Lumpectomy    A lumpectomy, sometimes called a partial mastectomy, is surgery to remove a cancerous tumor or mass (the lump) from a breast. It is a form of breast-conserving or breast-preservation surgery. This means that the cancerous tissue is removed but the breast remains intact. During a lumpectomy, the portion of the breast that contains the tumor is removed. Lymph nodes under your arm may also be removed. Lymph nodes are part of the body's disease-fighting system (immune system) and are usually the first place where breast cancer spreads. Tell a health care provider about: Any allergies you have. All medicines you are taking, including vitamins, herbs, eye drops, creams, and over-the-counter medicines. Any problems you or family members have had with anesthetic medicines. Any bleeding problems you have. Any surgeries you have had. Any medical conditions you have. Whether you are pregnant or may be pregnant. What are the risks? Generally, this is a safe procedure. However, problems may occur, including: Bleeding. Infection. Allergic reaction to medicines. Pain, swelling, weakness, or numbness in the arm on the side of your surgery. Temporary swelling. Change in the shape of the breast, especially if a large portion is removed. Scar tissue that forms at the surgical site and feels hard to the touch. Blood clots. What happens before the procedure? When to stop eating and drinking Follow instructions from your health care provider about what you may eat and drink. These may include: 8 hours before your procedure Stop eating most foods. Do not eat meat, fried foods, or  fatty foods. Eat only light foods, such as toast or crackers. All liquids are okay except energy drinks and alcohol. 6 hours before your procedure Stop eating. Drink only clear liquids, such as water, clear fruit juice, black coffee, plain tea, and sports drinks. Do not drink energy drinks or alcohol. 2 hours before your procedure Stop drinking all liquids. You may be allowed to take medicines with small sips of water. If you do not follow your health care provider's instructions, your procedure may be delayed or canceled. Medicines Ask your health care provider about: Changing or stopping your regular medicines. These include any diabetes medicines or blood thinners you take. Taking medicines such as aspirin and ibuprofen. These medicines can thin your blood. Do not take them unless your health care provider tells you to. Taking over-the-counter medicines, vitamins, herbs, and supplements. Surgery safety Ask your health care provider how your surgery site will be marked. A procedure may be done to locate and mark the tumor area in the breast (localization). This will guide the surgeon to where the incision will be made. This may be done with: Imaging, such as a mammogram, ultrasound, or MRI. Insertion of a small wire, clip, or seed, or an implant that will reflect a radar signal. Also, ask what steps will be taken to help prevent infection. These may include: Washing skin with a germ-killing soap. Taking antibiotic medicine. General instructions You may have screening tests or exams to get normal measurements of your arm, also called baseline measurements. These can be compared to measurements done after surgery to monitor for swelling (lymphedema) that can develop after having lymph nodes removed.   If you will be going home right after the procedure, plan to have a responsible adult: Take you home from the hospital or clinic. You will not be allowed to drive. Care for you for the time  you are told. What happens during the procedure?  An IV will be inserted into one of your veins. You may be given: A sedative. This helps you relax. Anesthesia. This will: Numb certain areas of your body. Make you fall asleep for surgery. An electric scalpel will be used to reduce bleeding (electrocautery knife). A curved incision that follows the natural curve of your breast will be made. The tumor will be removed along with some of the tissue around it. This will be sent to the lab for testing. Your health care provider may also remove lymph nodes at this time if needed. If the tumor is close to the muscles over your chest, some muscle tissue may also be removed. A small drain tube may be inserted into your breast area or armpit to collect fluid that may build up after surgery. This tube will be connected to a suction bulb on the outside of your body to remove the fluid. The incision will be closed with stitches (sutures). A bandage (dressing) may be placed over the incision. The procedure may vary among health care providers and hospitals. What happens after the procedure? Your blood pressure, heart rate, breathing rate, and blood oxygen level will be monitored until you leave the hospital or clinic. You will be given medicine for pain as needed. You will be encouraged to get up and walk as soon as you can. This will improve blood flow and breathing. Ask for help if you feel weak or unsteady. You may have a drain tube in place for 2-3 days to prevent a collection of blood (hematoma) from developing in the breast. You may have a pressure bandage applied for 1-2 days to prevent bleeding or swelling. Ask your health care provider how to care for your bandage at home. You may be given a tight sleeve to wear over your arm on the side of your surgery. Wear the sleeve as told by your health care provider. Do not drive or operate machinery until your health care provider says that it is  safe. Where to find more information American Cancer Society: cancer.org National Cancer Institute: cancer.gov Summary A lumpectomy, sometimes called a partial mastectomy, is surgery to remove a cancerous tumor or mass (the lump) from a breast. During a lumpectomy, the portion of the breast that contains the tumor is removed. Plan to have someone take you home from the hospital or clinic. You may have a drain tube in place for 2-3 days to prevent a collection of blood (hematoma) from developing in the breast. This information is not intended to replace advice given to you by your health care provider. Make sure you discuss any questions you have with your health care provider. Document Revised: 02/18/2022 Document Reviewed: 02/03/2022 Elsevier Patient Education  2023 Elsevier Inc.  

## 2023-03-28 NOTE — H&P (View-Only) (Signed)
03/28/2023  History of Present Illness: Ann Cox is a 46 y.o. female presenting for follow up of left breast DCIS.  She had an appointment with Ms. Cowan on 03/26/23 and called our office with decision to proceed with lumpectomy alone.  We had previously discussed options for lumpectomy, mastectomy with SLNBx, or bilateral mastectomies with reconstruction.  The patient denies any new symptoms or issues.  She wants to proceed with lumpectomy alone, followed by radiation.  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   MyRisk testing; (increased risk of breast/pancreatic)   Ovarian cyst      Past Surgical History: Past Surgical History:  Procedure Laterality Date   ARTHROSCOPY WITH ANTERIOR CRUCIATE LIGAMENT (ACL) REPAIR WITH ANTERIOR TIBILIAS GRAFT Left 03/10/2015   Procedure: LEFT KNEE ARTHROSCOPY MEDIAL MENISCECTOMY  ANTERIOR CRUCIATE LIGAMENT (ACL) TIBIAL ANTERIOR ALLOGRAFT;  Surgeon: Joshua Landau, MD;  Location: [facility];  Service: Orthopedics;  Laterality: Left;   BREAST BIOPSY Left 03/20/2023   Stereo bx, X-clip, path pending   BREAST BIOPSY Left 03/20/2023   MM LT BREAST BX W LOC DEV 1ST LESION IMAGE BX SPEC STEREO GUIDE 03/20/2023 [facility]   KNEE ARTHROSCOPY WITH MEDIAL MENISECTOMY Left 03/10/2015   Procedure: KNEE ARTHROSCOPY WITH MEDIAL MENISECTOMY;  Surgeon: Joshua Landau, MD;  Location: [facility];  Service: Orthopedics;  Laterality: Left;   ROBOTIC ASSISTED TOTAL HYSTERECTOMY WITH BILATERAL SALPINGO OOPHERECTOMY Bilateral 02/10/2023   Procedure:  XI ROBOTIC ASSISTED TOTAL LAPAROSCOPIC HYSTERECTOMY WITH BILATERAL SALPINGO OOPHORECTOMY;  Surgeon: Cherry, Anika, MD;  Location: [facility];  Service: Gynecology;  Laterality: Bilateral;    Home Medications: Prior to Admission medications   Medication Sig Start Date End Date Taking? Authorizing Provider  docusate sodium (COLACE) 100 MG capsule Take 1 capsule (100 mg total) by mouth 2 (two) times daily as needed. 02/10/23  Yes Cherry, Anika, MD  estrogens, conjugated, (PREMARIN) 0.625 MG tablet Take 1 tablet (0.625 mg total) by mouth daily. Take daily for 21 days then do not take for 7 days. 03/25/23  Yes Cherry, Anika, MD  ibuprofen (ADVIL) 600 MG tablet Take 1 tablet (600 mg total) by mouth every 6 (six) hours as needed. 02/10/23  Yes Cherry, Anika, MD  ketorolac (TORADOL) 10 MG tablet Take 1 tablet (10 mg total) by mouth every 6 (six) hours as needed. 02/11/23  Yes Cherry, Anika, MD  ondansetron (ZOFRAN) 4 MG tablet Take 1 tablet (4 mg total) by mouth every 8 (eight) hours as needed for nausea or vomiting. 02/11/23  Yes Cherry, Anika, MD  simethicone (GAS-X) 80 MG chewable tablet Chew 1 tablet (80 mg total) by mouth 4 (four) times daily as needed for flatulence. 02/10/23 02/10/24 Yes Cherry, Anika, MD  ZUBSOLV 2.9-0.71 MG SUBL Take 1.5 tablets by mouth daily. 07/27/19  Yes [provider]    Allergies: No Known Allergies  Review of Systems: Review of Systems  Constitutional:  Negative for chills and fever.  Respiratory:  Negative for shortness of breath.   Cardiovascular:  Negative for chest pain.    Gastrointestinal:  Negative for abdominal pain, nausea and vomiting.    Physical Exam BP 131/85   Pulse 81   Temp 98.7 F (37.1 C) (Oral)   Ht 5' 3" (1.6 m)   Wt 139 lb 9.6 oz (63.3 kg)   LMP 12/15/2021 (Approximate)   SpO2 96%   BMI 24.73 kg/m  CONSTITUTIONAL: No acute distress, well nourished. HEENT:  Normocephalic, atraumatic, extraocular motion intact. RESPIRATORY:  Normal  respiratory effort without pathologic use of accessory muscles. CARDIOVASCULAR: Regular rhythm and rate. BREAST:  Exam deferred today as I examined her two days ago. NEUROLOGIC:  Motor and sensation is grossly normal.  Cranial nerves are grossly intact. PSYCH:  Alert and oriented to person, place and time. Affect is normal.  Laboratory Analysis: Labs from 02/10/23: Na 138, K 4.0, Cl 105, CO2 22, BUN 9, Cr 0.6.  Hgb 13.3, Hct 39.   Left breast biopsy on 03/20/23: DIAGNOSIS:  A. 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    Imaging: Mammogram and U/S on 03/13/23: FINDINGS: Right: The previously described possible asymmetry in the central right breast does not persist with additional views, consistent with superimposed fibroglandular tissue. No suspicious mass, microcalcification, or other finding is identified.   Left: Spot magnification views demonstrate a 2.0 cm group of pleomorphic calcification in the central left breast middle depth. This corresponds with the area of screening mammogram concern. On spot tomosynthesis views, there is possible subtle associated architectural distortion (spot CC image 32/56), which corresponds with the questioned asymmetry on screening mammogram.   Targeted left breast ultrasound was performed by the sonographer and the physician at 12 o'clock 4 cm from the nipple. This demonstrates normal fibroglandular tissue. No sonographic correlate for the possible subtle architectural distortion is identified.   IMPRESSION: 1. Left breast 2.0 cm group of pleomorphic calcification in the central position middle depth is intermediate suspicion for malignancy. Of note, there is possible associated subtle architectural distortion without a sonographic correlate. Recommend further assessment with stereotactic guided biopsy. 2. The previously described possible asymmetry in the central  right breast does not persist with additional views, consistent with superimposed fibroglandular tissue. No mammographic evidence of malignancy in the right breast.   RECOMMENDATION: Left breast stereotactic guided biopsy (1 site).   BI-RADS CATEGORY  4: Suspicious.   Assessment and Plan: This is a 46 y.o. female with left breast DCIS  --The patient has decided to proceed with a lumpectomy as treatment for her left breast DCIS.  Discussed with her that this is definitely an acceptable treatment plan.  Discussed with her the steps going forward with first RF tag placement at [facility] for localization of the area of calcifications in the left breast, followed by day of surgery lumpectomy.  Reviewed the surgery at length with her including the planned incision based on localization of the tag, risks of bleeding, infection, injury to surrounding structures, intra-op imaging of the specimen and consultation with pathology for gross margin evaluation, that this would be an outpatient surgery, post-op activity restrictions, pain control, and she's willing to proceed. --Discussed with her that after surgery, she would need radiation and we would send a referral to Dr. Chrystal post-op.  She would also need yearly mammograms and breast MRI given her high risk. --Patient understands this plan and all of her questions have been answered. --Will schedule her for surgery on 04/10/23.  I spent 30 minutes dedicated to the care of   this patient on the date of this encounter to include pre-visit review of records, face-to-face time with the patient discussing diagnosis and management, and any post-visit coordination of care.   Pawan Knechtel Luis Richmond Coldren, MD [facility]     

## 2023-03-31 ENCOUNTER — Telehealth: Payer: Self-pay | Admitting: Surgery

## 2023-03-31 ENCOUNTER — Telehealth: Payer: Self-pay | Admitting: *Deleted

## 2023-03-31 ENCOUNTER — Other Ambulatory Visit: Payer: Self-pay | Admitting: Surgery

## 2023-03-31 DIAGNOSIS — D0512 Intraductal carcinoma in situ of left breast: Secondary | ICD-10-CM

## 2023-03-31 NOTE — Telephone Encounter (Signed)
Received FMLA form which has been completed and sent for physician signature

## 2023-03-31 NOTE — Telephone Encounter (Signed)
Patient has been advised of Pre-Admission date/time, and Surgery date at [facility].  Surgery Date: 04/10/23 Preadmission Testing Date: 04/02/23 (phone 8a-1p)  Patient has been made aware to call [phone], between 1-3:00pm the day before surgery, to find out what time to arrive for surgery.    Also patient reminded of RF tag to be placed at [facility] on 03/31/22 @ 3:40 pm

## 2023-04-01 ENCOUNTER — Ambulatory Visit
Admission: RE | Admit: 2023-04-01 | Discharge: 2023-04-01 | Disposition: A | Discharge status: Home / Self Care | Payer: BC Managed Care – PPO | Source: Ambulatory Visit | Attending: Surgery | Admitting: Surgery

## 2023-04-01 ENCOUNTER — Encounter: Payer: Self-pay | Admitting: Oncology

## 2023-04-01 DIAGNOSIS — D0512 Intraductal carcinoma in situ of left breast: Secondary | ICD-10-CM | POA: Diagnosis not present

## 2023-04-01 MED ORDER — LIDOCAINE HCL (PF) 1 % IJ SOLN
10.0000 mL | Freq: Once | INTRAMUSCULAR | Status: AC
  Administered 2023-04-01: 10 mL

## 2023-04-01 NOTE — Telephone Encounter (Signed)
FMLA completed, signed and faxed with receipt of confirmation received. Patient notified via My Chart msg

## 2023-04-02 ENCOUNTER — Encounter
Admission: RE | Admit: 2023-04-02 | Discharge: 2023-04-02 | Disposition: A | Discharge status: Home / Self Care | Payer: BC Managed Care – PPO | Source: Ambulatory Visit | Attending: Surgery | Admitting: Surgery

## 2023-04-02 NOTE — Patient Instructions (Signed)
Your procedure is scheduled on:04-10-23 Thursday Report to the Registration Desk on the 1st floor of the Medical Mall.Then proceed to the 2nd floor Surgery Desk To find out your arrival time, please call [phone] between 1PM - 3PM on:04-09-23 Wednesday If your arrival time is 6:00 am, do not arrive before that time as the Medical Mall entrance doors do not open until 6:00 am.  REMEMBER: Instructions that are not followed completely may result in serious medical risk, up to and including death; or upon the discretion of your surgeon and anesthesiologist your surgery may need to be rescheduled.  Do not eat food after midnight the night before surgery.  No gum chewing or hard candies.  You may however, drink CLEAR liquids up to 2 hours before you are scheduled to arrive for your surgery. Do not drink anything within 2 hours of your scheduled arrival time.  Clear liquids include: - water  - apple juice without pulp - gatorade (not RED colors) - black coffee or tea (Do NOT add milk or creamers to the coffee or tea) Do NOT drink anything that is not on this list.  One week prior to surgery:Last dose on 04-02-23 Stop Anti-inflammatories (NSAIDS) such as Advil, Aleve, Ibuprofen, Motrin, Naproxen, Naprosyn and Aspirin based products such as Excedrin, Goody's Powder, BC Powder.You may however, take Tylenol if needed for pain up until the day of surgery. Stop ANY OVER THE COUNTER supplements/vitamins NOW (04-02-23) until after surgery.  Do NOT take any medication the day of surgery  No Alcohol for 24 hours before or after surgery.  No Smoking including e-cigarettes for 24 hours before surgery.  No chewable tobacco products for at least 6 hours before surgery.  No nicotine patches on the day of surgery.  Do not use any "recreational" drugs for at least a week (preferably 2 weeks) before your surgery.  Please be advised that the combination of cocaine and anesthesia may have negative outcomes, up  to and including death. If you test positive for cocaine, your surgery will be cancelled.  On the morning of surgery brush your teeth with toothpaste and water, you may rinse your mouth with mouthwash if you wish. Do not swallow any toothpaste or mouthwash.  Use CHG Soap as directed on instruction sheet.  Do not wear jewelry, make-up, hairpins, clips or nail polish.  Do not wear lotions, powders, or perfumes.   Do not shave body hair from the neck down 48 hours before surgery.  Contact lenses, hearing aids and dentures may not be worn into surgery.  Do not bring valuables to the hospital. [facility] is not responsible for any missing/lost belongings or valuables.   Notify your doctor if there is any change in your medical condition (cold, fever, infection).  Wear comfortable clothing (specific to your surgery type) to the hospital.  After surgery, you can help prevent lung complications by doing breathing exercises.  Take deep breaths and cough every 1-2 hours. Your doctor may order a device called an Incentive Spirometer to help you take deep breaths. When coughing or sneezing, hold a pillow firmly against your incision with both hands. This is called "splinting." Doing this helps protect your incision. It also decreases belly discomfort.  If you are being admitted to the hospital overnight, leave your suitcase in the car. After surgery it may be brought to your room.  In case of increased patient census, it may be necessary for you, the patient, to continue your postoperative care in the  Same Day Surgery department.  If you are being discharged the day of surgery, you will not be allowed to drive home. You will need a responsible individual to drive you home and stay with you for 24 hours after surgery.   If you are taking public transportation, you will need to have a responsible individual with you.  Please call the [facility] at [phone] if you have  any questions about these instructions.  Surgery Visitation Policy:  Patients having surgery or a procedure may have two visitors.  Children under the age of 63 must have an adult with them who is not the patient.     Preparing for Surgery with CHLORHEXIDINE GLUCONATE (CHG) Soap  Chlorhexidine Gluconate (CHG) Soap  o An antiseptic cleaner that kills germs and bonds with the skin to continue killing germs even after washing  o Used for showering the night before surgery and morning of surgery  Before surgery, you can play an important role by reducing the number of germs on your skin.  CHG (Chlorhexidine gluconate) soap is an antiseptic cleanser which kills germs and bonds with the skin to continue killing germs even after washing.  Please do not use if you have an allergy to CHG or antibacterial soaps. If your skin becomes reddened/irritated stop using the CHG.  1. Shower the NIGHT BEFORE SURGERY and the MORNING OF SURGERY with CHG soap.  2. If you choose to wash your hair, wash your hair first as usual with your normal shampoo.  3. After shampooing, rinse your hair and body thoroughly to remove the shampoo.  4. Use CHG as you would any other liquid soap. You can apply CHG directly to the skin and wash gently with a scrungie or a clean washcloth.  5. Apply the CHG soap to your body only from the neck down. Do not use on open wounds or open sores. Avoid contact with your eyes, ears, mouth, and genitals (private parts). Wash face and genitals (private parts) with your normal soap.  6. Wash thoroughly, paying special attention to the area where your surgery will be performed.  7. Thoroughly rinse your body with warm water.  8. Do not shower/wash with your normal soap after using and rinsing off the CHG soap.  9. Pat yourself dry with a clean towel.  10. Wear clean pajamas to bed the night before surgery.  12. Place clean sheets on your bed the night of your first shower and do  not sleep with pets.  13. Shower again with the CHG soap on the day of surgery prior to arriving at the hospital.  14. Do not apply any deodorants/lotions/powders.  15. Please wear clean clothes to the hospital.

## 2023-04-03 ENCOUNTER — Encounter: Payer: Self-pay | Admitting: *Deleted

## 2023-04-03 DIAGNOSIS — D0512 Intraductal carcinoma in situ of left breast: Secondary | ICD-10-CM

## 2023-04-03 NOTE — Progress Notes (Signed)
Lumpectomy scheduled for 04/10/23.   She will see Dr. Orlie Dakin and Dr. Rushie Chestnut on 5/16.  Called patient to give appt. Details, unable to leave a message, mailbox is full.   Will also send mychart message.

## 2023-04-08 ENCOUNTER — Encounter: Payer: Self-pay | Admitting: Licensed Clinical Social Worker

## 2023-04-08 ENCOUNTER — Telehealth: Payer: Self-pay | Admitting: Licensed Clinical Social Worker

## 2023-04-08 DIAGNOSIS — Z1501 Genetic susceptibility to malignant neoplasm of breast: Secondary | ICD-10-CM | POA: Insufficient documentation

## 2023-04-08 NOTE — Telephone Encounter (Signed)
I contacted Ms. Spare via Mychart message to discuss her genetic testing results. 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 No other pathogenic variants identified on the [facility] STAT panel. The remainder of testing is pending.   The test report has been scanned into EPIC and is located under the Molecular Pathology section of the Results Review tab.  A portion of the result report is included below for reference.      Lacy Duverney, MS, [facility] Genetic Counselor [email or web address] Phone: [phone]

## 2023-04-09 ENCOUNTER — Inpatient Hospital Stay: Payer: BC Managed Care – PPO | Attending: Oncology | Admitting: Hospice and Palliative Medicine

## 2023-04-09 DIAGNOSIS — Z1509 Genetic susceptibility to other malignant neoplasm: Secondary | ICD-10-CM | POA: Insufficient documentation

## 2023-04-09 DIAGNOSIS — Z1501 Genetic susceptibility to malignant neoplasm of breast: Secondary | ICD-10-CM | POA: Insufficient documentation

## 2023-04-09 DIAGNOSIS — D0512 Intraductal carcinoma in situ of left breast: Secondary | ICD-10-CM

## 2023-04-09 DIAGNOSIS — Z1502 Genetic susceptibility to malignant neoplasm of ovary: Secondary | ICD-10-CM | POA: Insufficient documentation

## 2023-04-09 DIAGNOSIS — Z170 Estrogen receptor positive status [ER+]: Secondary | ICD-10-CM | POA: Insufficient documentation

## 2023-04-09 NOTE — Progress Notes (Signed)
Multidisciplinary Oncology Council Documentation  Ann Cox was presented by our [facility] on 04/09/2023, which included representatives from:  Palliative Care Dietitian  Physical/Occupational Therapist Nurse Navigator Genetics Speech Therapist Social work Survivorship RN Financial Navigator Research RN   Ann Cox currently presents with history of breast cancer  We reviewed previous medical and familial history, history of present illness, and recent lab results along with all available histopathologic and imaging studies. The MOC considered available treatment options and made the following recommendations/referrals:  Rehab screening  The MOC is a meeting of clinicians from various specialty areas who evaluate and discuss patients for whom a multidisciplinary approach is being considered. Final determinations in the plan of care are those of the provider(s).   Today's extended care, comprehensive team conference, Ann Cox was not present for the discussion and was not examined.

## 2023-04-09 NOTE — Progress Notes (Signed)
I contacted Ann Cox by [employer] message to discuss her genetic testing results. 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 No other pathogenic variants identified.     The test report has been scanned into EPIC and is located under the Molecular Pathology section of the Results Review tab.  A portion of the result report is included below for reference.

## 2023-04-10 ENCOUNTER — Other Ambulatory Visit: Payer: Self-pay

## 2023-04-10 ENCOUNTER — Ambulatory Visit
Admission: RE | Admit: 2023-04-10 | Discharge: 2023-04-10 | Disposition: A | Discharge status: Home / Self Care | Payer: BC Managed Care – PPO | Attending: Surgery | Admitting: Surgery

## 2023-04-10 ENCOUNTER — Ambulatory Visit: Payer: BC Managed Care – PPO | Admitting: Certified Registered"

## 2023-04-10 ENCOUNTER — Ambulatory Visit
Admission: RE | Admit: 2023-04-10 | Discharge: 2023-04-10 | Disposition: A | Discharge status: Home / Self Care | Payer: BC Managed Care – PPO | Source: Ambulatory Visit | Attending: Surgery | Admitting: Surgery

## 2023-04-10 ENCOUNTER — Telehealth: Payer: Self-pay | Admitting: *Deleted

## 2023-04-10 ENCOUNTER — Encounter: Payer: Self-pay | Admitting: Surgery

## 2023-04-10 ENCOUNTER — Encounter
Admission: RE | Disposition: A | Discharge status: Home / Self Care | Payer: Self-pay | Source: Home / Self Care | Attending: Surgery

## 2023-04-10 DIAGNOSIS — D0512 Intraductal carcinoma in situ of left breast: Secondary | ICD-10-CM | POA: Insufficient documentation

## 2023-04-10 DIAGNOSIS — N6489 Other specified disorders of breast: Secondary | ICD-10-CM | POA: Insufficient documentation

## 2023-04-10 DIAGNOSIS — Z1501 Genetic susceptibility to malignant neoplasm of breast: Secondary | ICD-10-CM | POA: Diagnosis not present

## 2023-04-10 DIAGNOSIS — F17200 Nicotine dependence, unspecified, uncomplicated: Secondary | ICD-10-CM | POA: Diagnosis not present

## 2023-04-10 DIAGNOSIS — N6022 Fibroadenosis of left breast: Secondary | ICD-10-CM | POA: Insufficient documentation

## 2023-04-10 HISTORY — PX: BREAST LUMPECTOMY WITH RADIOFREQUENCY TAG IDENTIFICATION: SHX6884

## 2023-04-10 SURGERY — BREAST LUMPECTOMY WITH RADIOFREQUENCY TAG IDENTIFICATION
Anesthesia: General | Laterality: Left

## 2023-04-10 MED ORDER — DEXAMETHASONE SODIUM PHOSPHATE 10 MG/ML IJ SOLN
INTRAMUSCULAR | Status: DC | PRN
  Administered 2023-04-10: 10 mg via INTRAVENOUS

## 2023-04-10 MED ORDER — FENTANYL CITRATE (PF) 100 MCG/2ML IJ SOLN
INTRAMUSCULAR | Status: AC
  Filled 2023-04-10: qty 2

## 2023-04-10 MED ORDER — FAMOTIDINE 20 MG PO TABS
ORAL_TABLET | ORAL | Status: AC
  Filled 2023-04-10: qty 1

## 2023-04-10 MED ORDER — CHLORHEXIDINE GLUCONATE 0.12 % MT SOLN
OROMUCOSAL | Status: AC
  Filled 2023-04-10: qty 15

## 2023-04-10 MED ORDER — CEFAZOLIN SODIUM-DEXTROSE 2-4 GM/100ML-% IV SOLN
2.0000 g | INTRAVENOUS | Status: AC
  Administered 2023-04-10: 2 g via INTRAVENOUS

## 2023-04-10 MED ORDER — BUPIVACAINE HCL (PF) 0.5 % IJ SOLN
INTRAMUSCULAR | Status: AC
  Filled 2023-04-10: qty 30

## 2023-04-10 MED ORDER — ORAL CARE MOUTH RINSE: 15.0000 mL | Freq: Once | OROMUCOSAL | Status: AC

## 2023-04-10 MED ORDER — FAMOTIDINE 20 MG PO TABS
20.0000 mg | ORAL_TABLET | Freq: Once | ORAL | Status: AC
  Administered 2023-04-10: 20 mg via ORAL

## 2023-04-10 MED ORDER — FENTANYL CITRATE (PF) 100 MCG/2ML IJ SOLN: 25.0000 ug | INTRAMUSCULAR | Status: DC | PRN

## 2023-04-10 MED ORDER — ONDANSETRON HCL 4 MG/2ML IJ SOLN
INTRAMUSCULAR | Status: DC | PRN
  Administered 2023-04-10: 4 mg via INTRAVENOUS

## 2023-04-10 MED ORDER — GABAPENTIN 300 MG PO CAPS
ORAL_CAPSULE | ORAL | Status: AC
  Filled 2023-04-10: qty 1

## 2023-04-10 MED ORDER — MIDAZOLAM HCL 2 MG/2ML IJ SOLN
INTRAMUSCULAR | Status: AC
  Filled 2023-04-10: qty 2

## 2023-04-10 MED ORDER — IBUPROFEN 800 MG PO TABS
800.0000 mg | ORAL_TABLET | Freq: Three times a day (TID) | ORAL | 1 refills | Status: DC | PRN
Start: 2023-04-10 — End: 2024-03-15

## 2023-04-10 MED ORDER — BUPIVACAINE LIPOSOME 1.3 % IJ SUSP: 20.0000 mL | Freq: Once | INTRAMUSCULAR | Status: DC

## 2023-04-10 MED ORDER — CHLORHEXIDINE GLUCONATE CLOTH 2 % EX PADS
6.0000 | MEDICATED_PAD | Freq: Once | CUTANEOUS | Status: AC
  Administered 2023-04-10: 6 via TOPICAL

## 2023-04-10 MED ORDER — BUPIVACAINE LIPOSOME 1.3 % IJ SUSP
INTRAMUSCULAR | Status: DC | PRN
  Administered 2023-04-10: 20 mL

## 2023-04-10 MED ORDER — EPHEDRINE SULFATE (PRESSORS) 50 MG/ML IJ SOLN
INTRAMUSCULAR | Status: DC | PRN
  Administered 2023-04-10: 5 mg via INTRAVENOUS

## 2023-04-10 MED ORDER — PROPOFOL 10 MG/ML IV BOLUS
INTRAVENOUS | Status: DC | PRN
  Administered 2023-04-10: 150 mg via INTRAVENOUS
  Administered 2023-04-10: 50 mg via INTRAVENOUS

## 2023-04-10 MED ORDER — OXYCODONE HCL 5 MG PO TABS
5.0000 mg | ORAL_TABLET | ORAL | 0 refills | Status: DC | PRN
Start: 2023-04-10 — End: 2024-03-15

## 2023-04-10 MED ORDER — BUPIVACAINE-EPINEPHRINE 0.5% -1:200000 IJ SOLN
INTRAMUSCULAR | Status: DC | PRN
  Administered 2023-04-10: 30 mL

## 2023-04-10 MED ORDER — ACETAMINOPHEN 500 MG PO TABS
1000.0000 mg | ORAL_TABLET | ORAL | Status: AC
  Administered 2023-04-10: 1000 mg via ORAL

## 2023-04-10 MED ORDER — PROPOFOL 10 MG/ML IV BOLUS
INTRAVENOUS | Status: AC
  Filled 2023-04-10: qty 20

## 2023-04-10 MED ORDER — CEFAZOLIN SODIUM-DEXTROSE 2-4 GM/100ML-% IV SOLN
INTRAVENOUS | Status: AC
  Filled 2023-04-10: qty 100

## 2023-04-10 MED ORDER — EPINEPHRINE PF 1 MG/ML IJ SOLN
INTRAMUSCULAR | Status: AC
  Filled 2023-04-10: qty 1

## 2023-04-10 MED ORDER — ACETAMINOPHEN 500 MG PO TABS: 1000.0000 mg | ORAL_TABLET | Freq: Four times a day (QID) | ORAL | Status: DC | PRN

## 2023-04-10 MED ORDER — FENTANYL CITRATE (PF) 100 MCG/2ML IJ SOLN
INTRAMUSCULAR | Status: DC | PRN
  Administered 2023-04-10 (×2): 50 ug via INTRAVENOUS

## 2023-04-10 MED ORDER — KETOROLAC TROMETHAMINE 30 MG/ML IJ SOLN
INTRAMUSCULAR | Status: DC | PRN
  Administered 2023-04-10: 30 mg via INTRAVENOUS

## 2023-04-10 MED ORDER — BUPIVACAINE LIPOSOME 1.3 % IJ SUSP
INTRAMUSCULAR | Status: AC
  Filled 2023-04-10: qty 10

## 2023-04-10 MED ORDER — CHLORHEXIDINE GLUCONATE 0.12 % MT SOLN
15.0000 mL | Freq: Once | OROMUCOSAL | Status: AC
  Administered 2023-04-10: 15 mL via OROMUCOSAL

## 2023-04-10 MED ORDER — GABAPENTIN 300 MG PO CAPS
300.0000 mg | ORAL_CAPSULE | ORAL | Status: AC
  Administered 2023-04-10: 300 mg via ORAL

## 2023-04-10 MED ORDER — ACETAMINOPHEN 500 MG PO TABS
ORAL_TABLET | ORAL | Status: AC
  Filled 2023-04-10: qty 2

## 2023-04-10 MED ORDER — LACTATED RINGERS IV SOLN: INTRAVENOUS | Status: DC

## 2023-04-10 MED ORDER — DROPERIDOL 2.5 MG/ML IJ SOLN: 0.6250 mg | Freq: Once | INTRAMUSCULAR | Status: DC | PRN

## 2023-04-10 SURGICAL SUPPLY — 51 items
ADH SKN CLS APL DERMABOND .7 (GAUZE/BANDAGES/DRESSINGS) ×1
APL PRP STRL LF DISP 70% ISPRP (MISCELLANEOUS) ×1
APPLIER CLIP 9.375 SM OPEN (CLIP)
APR CLP SM 9.3 20 MLT OPN (CLIP)
BINDER BREAST LRG (GAUZE/BANDAGES/DRESSINGS) IMPLANT
BINDER BREAST MEDIUM (GAUZE/BANDAGES/DRESSINGS) IMPLANT
BLADE PHOTON ILLUMINATED (MISCELLANEOUS) ×1 IMPLANT
BLADE SURG 15 STRL LF DISP TIS (BLADE) ×2 IMPLANT
BLADE SURG 15 STRL SS (BLADE)
CHLORAPREP W/TINT 26 (MISCELLANEOUS) ×1 IMPLANT
CLIP APPLIE 9.375 SM OPEN (CLIP) IMPLANT
CNTNR URN SCR LID CUP LEK RST (MISCELLANEOUS) IMPLANT
CONT SPEC 4OZ STRL OR WHT (MISCELLANEOUS)
DERMABOND ADVANCED .7 DNX12 (GAUZE/BANDAGES/DRESSINGS) ×1 IMPLANT
DEVICE DUBIN SPECIMEN MAMMOGRA (MISCELLANEOUS) ×1 IMPLANT
DRAPE LAPAROTOMY 100X77 ABD (DRAPES) ×1 IMPLANT
DRSG GAUZE FLUFF 36X18 (GAUZE/BANDAGES/DRESSINGS) ×1 IMPLANT
ELECT REM PT RETURN 9FT ADLT (ELECTROSURGICAL) ×1
ELECTRODE REM PT RTRN 9FT ADLT (ELECTROSURGICAL) ×1 IMPLANT
GAUZE 4X4 16PLY ~~LOC~~+RFID DBL (SPONGE) ×1 IMPLANT
GLOVE SURG SYN 7.0 (GLOVE) ×3 IMPLANT
GLOVE SURG SYN 7.0 PF PI (GLOVE) ×1 IMPLANT
GLOVE SURG SYN 7.5  E (GLOVE) ×3
GLOVE SURG SYN 7.5 E (GLOVE) ×3 IMPLANT
GLOVE SURG SYN 7.5 PF PI (GLOVE) ×1 IMPLANT
GOWN STRL REUS W/ TWL LRG LVL3 (GOWN DISPOSABLE) ×2 IMPLANT
GOWN STRL REUS W/TWL LRG LVL3 (GOWN DISPOSABLE) ×3
KIT MARKER MARGIN INK (KITS) IMPLANT
KIT TURNOVER KIT A (KITS) ×1 IMPLANT
LABEL OR SOLS (LABEL) ×1 IMPLANT
MANIFOLD NEPTUNE II (INSTRUMENTS) ×1 IMPLANT
NDL HYPO 22X1.5 SAFETY MO (MISCELLANEOUS) ×1 IMPLANT
NEEDLE HYPO 22X1.5 SAFETY MO (MISCELLANEOUS) ×1 IMPLANT
PACK BASIN MINOR ARMC (MISCELLANEOUS) ×1 IMPLANT
SET LOCALIZER 20 PROBE US (MISCELLANEOUS) ×1 IMPLANT
SUT ETHILON 3-0 FS-10 30 BLK (SUTURE)
SUT MNCRL 4-0 (SUTURE) ×1
SUT MNCRL 4-0 27XMFL (SUTURE) ×1
SUT SILK 3 0 SH 30 (SUTURE) IMPLANT
SUT VIC AB 2-0 SH 27 (SUTURE) ×1
SUT VIC AB 2-0 SH 27XBRD (SUTURE) IMPLANT
SUT VIC AB 3-0 SH 27 (SUTURE) ×1
SUT VIC AB 3-0 SH 27X BRD (SUTURE) ×1 IMPLANT
SUTURE EHLN 3-0 FS-10 30 BLK (SUTURE) IMPLANT
SUTURE MNCRL 4-0 27XMF (SUTURE) ×1 IMPLANT
SYR 10ML LL (SYRINGE) ×1 IMPLANT
TAPE TRANSPORE STRL 2 31045 (GAUZE/BANDAGES/DRESSINGS) IMPLANT
TRAP FLUID SMOKE EVACUATOR (MISCELLANEOUS) ×1 IMPLANT
TRAP NEPTUNE SPECIMEN COLLECT (MISCELLANEOUS) ×1 IMPLANT
WATER STERILE IRR 1000ML POUR (IV SOLUTION) ×1 IMPLANT
WATER STERILE IRR 500ML POUR (IV SOLUTION) ×1 IMPLANT

## 2023-04-10 NOTE — Telephone Encounter (Signed)
Faxed FMLA to [employer] at [phone] 

## 2023-04-10 NOTE — Interval H&P Note (Signed)
History and Physical Interval Note:  04/10/2023 7:10 AM  Ann Cox  has presented today for surgery, with the diagnosis of Left breast DCIS.  The various methods of treatment have been discussed with the patient and family. After consideration of risks, benefits and other options for treatment, the patient has consented to  Procedure(s): BREAST LUMPECTOMY WITH RADIOFREQUENCY TAG IDENTIFICATION (Left) as a surgical intervention.  The patient's history has been reviewed, patient examined, no change in status, stable for surgery.  I have reviewed the patient's chart and labs.  Questions were answered to the patient's satisfaction.     Basilio Meadow

## 2023-04-10 NOTE — Anesthesia Preprocedure Evaluation (Signed)
Anesthesia Evaluation  Patient identified by MRN, date of birth, ID band Patient awake    Reviewed: Allergy & Precautions, NPO status , Patient's Chart, lab work & pertinent test results  History of Anesthesia Complications Negative for: history of anesthetic complications  Airway Mallampati: III  TM Distance: >3 FB Neck ROM: full    Dental  (+) Dental Advidsory Given, Teeth Intact, Chipped   Pulmonary neg shortness of breath, neg COPD, neg recent URI, Current Smoker and Patient abstained from smoking.   Pulmonary exam normal        Cardiovascular negative cardio ROS Normal cardiovascular exam     Neuro/Psych negative neurological ROS  negative psych ROS   GI/Hepatic negative GI ROS, Neg liver ROS,,,  Endo/Other  negative endocrine ROS    Renal/GU      Musculoskeletal   Abdominal   Peds  Hematology negative hematology ROS (+)   Anesthesia Other Findings Past Medical History: 03/10/2014: ACL (anterior cruciate ligament) rupture 02/2015: ACL sprain     Comment:  left 11/2015: BRCA negative     Comment:  MyRisk BRCA/BART neg No date: Family history of ovarian cancer No date: H/O urinary retention No date: History of recurrent UTI (urinary tract infection) No date: Iron deficiency anemia 02/2015: Medial meniscus tear     Comment:  left knee No date: Menorrhagia 11/2015: Monoallelic mutation of ATM gene     Comment:  MyRisk testing; (increased risk of breast/pancreatic) No date: Ovarian cyst  Past Surgical History: 03/10/2015: ARTHROSCOPY WITH ANTERIOR CRUCIATE LIGAMENT (ACL) REPAIR  WITH ANTERIOR TIBILIAS GRAFT; Left     Comment:  Procedure: LEFT KNEE ARTHROSCOPY MEDIAL MENISCECTOMY                ANTERIOR CRUCIATE LIGAMENT (ACL) TIBIAL ANTERIOR               ALLOGRAFT;  Surgeon: Teryl Lucy, MD;  Location: [facility];  Service: Orthopedics;  Laterality:                Left; 03/10/2015: KNEE ARTHROSCOPY WITH MEDIAL MENISECTOMY; Left     Comment:  Procedure: KNEE ARTHROSCOPY WITH MEDIAL MENISECTOMY;                Surgeon: Teryl Lucy, MD;  Location: [facility];  Service: Orthopedics;  Laterality: Left;  BMI    Body Mass Index: 25.17 kg/m      Reproductive/Obstetrics negative OB ROS                             Anesthesia Physical Anesthesia Plan  ASA: 2  Anesthesia Plan: General   Post-op Pain Management: Tylenol PO (pre-op)*, Celebrex PO (pre-op)*, Gabapentin PO (pre-op)* and Dilaudid IV   Induction: Intravenous  PONV Risk Score and Plan: Ondansetron, Dexamethasone, Midazolam and Treatment may vary due to age or medical condition  Airway Management Planned: LMA  Additional Equipment:   Intra-op Plan:   Post-operative Plan: Extubation in OR  Informed Consent: I have reviewed the patients History and Physical, chart, labs and discussed the procedure including the risks, benefits and alternatives for the proposed anesthesia with the patient or authorized representative who has indicated his/her understanding and acceptance.     Dental Advisory Given  Plan Discussed with: Anesthesiologist, CRNA and Surgeon  Anesthesia Plan Comments: (Patient consented for risks of anesthesia including but not limited to:  - adverse reactions to medications - damage to eyes, teeth, lips or other oral mucosa - nerve damage due to positioning  - sore throat or hoarseness - Damage to heart, brain, nerves, lungs, other parts of body or loss of life  Patient voiced understanding.)       Anesthesia Quick Evaluation

## 2023-04-10 NOTE — Op Note (Signed)
  Procedure Date:  04/10/2023  Pre-operative Diagnosis:  Left breast DCIS  Post-operative Diagnosis: Left breast DCIS  Procedure:  Left breast RF tag-localized lumpectomy  Surgeon:  Howie Ill, MD  Anesthesia:  General endotracheal  Estimated Blood Loss:  10 ml  Specimens:  Left breast DCIS  Complications:  None  Indications for Procedure:  This is a 47 y.o. female who presents with left breast DCIS.  The risks of bleeding, infection, injury to surrounding structures, hematoma, seroma, open wound, cosmetic deformity, and the need for further surgery were all discussed with the patient and was willing to proceed.  Prior to this procedure, the patient had undergone RF tag localization.  Description of Procedure: The patient was correctly identified in the preoperative area and brought into the operating room.  The patient was placed supine with VTE prophylaxis in place.  Appropriate time-outs were performed.  Anesthesia was induced and the patient was intubated.  Appropriate antibiotics were infused.  The left chest was prepped and draped in usual sterile fashion.  The RF tag localization site was determined using the Hologic probe in the left central breast.  An incision was made overlying the tag and clip.  Hologic probe was used to guide our dissection using electrocautery, and a partial mastectomy was performed with adequate margins.  MarginMarker was used to ink each of the sides of the specimen.  The specimen was then imaged to confirm that the area of concern, biopsy clip, and RF tag were included in the excision.  This was then sent to pathology.  The cavity was irrigated and hemostasis was assured with electrocautery.  Local anesthetic was infiltrated into the skin and subcutaneous tissue of the cavity.  The wound was then closed in three layers with 2-0 Vicryl, 3-0 Vicryl and 4-0 Monocryl and sealed with DermaBond.  The patient was emerged from anesthesia and extubated and  brought to the recovery room for further management.  The patient tolerated the procedure well and all counts were correct at the end of the case.   Howie Ill, MD

## 2023-04-10 NOTE — Anesthesia Procedure Notes (Signed)
Procedure Name: LMA Insertion Date/Time: 04/10/2023 7:43 AM  Performed by: Danelle Berry, CRNAPre-anesthesia Checklist: Patient identified, Emergency Drugs available, Suction available, Patient being monitored and Timeout performed Patient Re-evaluated:Patient Re-evaluated prior to induction Oxygen Delivery Method: Circle system utilized and Simple face mask Preoxygenation: Pre-oxygenation with 100% oxygen LMA Size: 3.0 Number of attempts: 1

## 2023-04-10 NOTE — Transfer of Care (Signed)
Immediate Anesthesia Transfer of Care Note  Patient: Sweta Halseth  Procedure(s) Performed: BREAST LUMPECTOMY WITH RADIOFREQUENCY TAG IDENTIFICATION (Left)  Patient Location: PACU  Anesthesia Type:General  Level of Consciousness: awake, alert , and oriented  Airway & Oxygen Therapy: Patient Spontanous Breathing  Post-op Assessment: Report given to RN and Post -op Vital signs reviewed and stable  Post vital signs: Reviewed and stable  Last Vitals:  Vitals Value Taken Time  BP 122/72 04/10/23 0908  Temp    Pulse 69 04/10/23 0910  Resp    SpO2 100 % 04/10/23 0910  Vitals shown include unvalidated device data.  Last Pain:  Vitals:   04/10/23 0619  TempSrc: Temporal  PainSc: 0-No pain         Complications: No notable events documented.

## 2023-04-10 NOTE — Discharge Instructions (Addendum)
Discharge Instructions: 1.  Patient may shower, but do not scrub wounds heavily and dab dry only. 2.  Do not submerge wounds in pool/tub until fully healed. 3.  Do not apply ointments or hydrogen peroxide to the wounds. 4.  May apply ice packs to the wounds for comfort. 5.  Please wear the breast binder at all times for the next 2 weeks.  May remove for showers.  May apply fluffed gauze dressing over incision to help with padding/comfort. 6.  Do not drive while taking narcotics for pain control.  Prior to driving, make sure you are able to rotate right and left to look at blindspots without significant pain or discomfort. 7.  Avoid strenuous activity with the left arm x 2 weeks.  AMBULATORY SURGERY  DISCHARGE INSTRUCTIONS   The drugs that you were given will stay in your system until tomorrow so for the next 24 hours you should not:  Drive an automobile Make any legal decisions Drink any alcoholic beverage   You may resume regular meals tomorrow.  Today it is better to start with liquids and gradually work up to solid foods.  You may eat anything you prefer, but it is better to start with liquids, then soup and crackers, and gradually work up to solid foods.   Please notify your doctor immediately if you have any unusual bleeding, trouble breathing, redness and pain at the surgery site, drainage, fever, or pain not relieved by medication.    Additional Instructions:        Please contact your physician with any problems or Same Day Surgery at [phone], Monday through Friday 6 am to 4 pm, or [place] at [facility] number at [phone].    Information for Discharge Teaching: EXPAREL (bupivacaine liposome injectable suspension)   Your surgeon or anesthesiologist gave you EXPAREL(bupivacaine) to help control your pain after surgery.  EXPAREL is a local anesthetic that provides pain relief by numbing the tissue around the surgical site. EXPAREL is designed to  release pain medication over time and can control pain for up to 72 hours. Depending on how you respond to EXPAREL, you may require less pain medication during your recovery.  Possible side effects: Temporary loss of sensation or ability to move in the area where bupivacaine was injected. Nausea, vomiting, constipation Rarely, numbness and tingling in your mouth or lips, lightheadedness, or anxiety may occur. Call your doctor right away if you think you may be experiencing any of these sensations, or if you have other questions regarding possible side effects.  Follow all other discharge instructions given to you by your surgeon or nurse. Eat a healthy diet and drink plenty of water or other fluids.  If you return to the hospital for any reason within 96 hours following the administration of EXPAREL, it is important for health care providers to know that you have received this anesthetic. A teal colored band has been placed on your arm with the date, time and amount of EXPAREL you have received in order to alert and inform your health care providers. Please leave this armband in place for the full 96 hours following administration, and then you may remove the band.

## 2023-04-11 ENCOUNTER — Encounter: Payer: Self-pay | Admitting: Surgery

## 2023-04-11 ENCOUNTER — Telehealth: Payer: Self-pay | Admitting: Surgery

## 2023-04-11 ENCOUNTER — Other Ambulatory Visit: Payer: Self-pay | Admitting: Anatomic Pathology & Clinical Pathology

## 2023-04-11 NOTE — Telephone Encounter (Signed)
Patient had breast lumpectomy done yesterday 04/10/23 Dr. Aleen Campi.  Patient states that she was prescribed Oxycodone and she can't take this, it makes her sick.  She takes Zubsolv and with this can't take any opiates.  In the past when had hysterectomy was able to tolerate Ketorolac.  Wondering if this can be called in for her instead.  She uses [facility] in [place].   Please call her, thank you.

## 2023-04-11 NOTE — Progress Notes (Signed)
04/11/23 Called the patient to discuss pathology results.  Specimen showed only DCIS, without any invasive component.  Other benign findings as well.  Discussed the results with her.  She's otherwise doing well.  Will see her in follow up on 04/23/23  Henrene Dodge, MD

## 2023-04-14 LAB — SURGICAL PATHOLOGY

## 2023-04-15 NOTE — Anesthesia Postprocedure Evaluation (Signed)
Anesthesia Post Note  Patient: Ann Cox  Procedure(s) Performed: BREAST LUMPECTOMY WITH RADIOFREQUENCY TAG IDENTIFICATION (Left)  Patient location during evaluation: PACU Anesthesia Type: General Level of consciousness: awake and alert Pain management: pain level controlled Vital Signs Assessment: post-procedure vital signs reviewed and stable Respiratory status: spontaneous breathing, nonlabored ventilation, respiratory function stable and patient connected to nasal cannula oxygen Cardiovascular status: blood pressure returned to baseline and stable Postop Assessment: no apparent nausea or vomiting Anesthetic complications: no   No notable events documented.   Last Vitals:  Vitals:   04/10/23 1015 04/10/23 1033  BP: 114/72 127/78  Pulse: 63   Resp: 11 16  Temp:  36.7 C  SpO2: 96% 97%    Last Pain:  Vitals:   04/10/23 1033  TempSrc:   PainSc: 0-No pain                 Lenard Simmer

## 2023-04-22 ENCOUNTER — Other Ambulatory Visit: Payer: BC Managed Care – PPO

## 2023-04-22 ENCOUNTER — Encounter: Payer: BC Managed Care – PPO | Admitting: Licensed Clinical Social Worker

## 2023-04-23 ENCOUNTER — Inpatient Hospital Stay: Payer: BC Managed Care – PPO | Admitting: Occupational Therapy

## 2023-04-23 ENCOUNTER — Ambulatory Visit (INDEPENDENT_AMBULATORY_CARE_PROVIDER_SITE_OTHER): Payer: BC Managed Care – PPO | Admitting: Surgery

## 2023-04-23 ENCOUNTER — Encounter: Payer: Self-pay | Admitting: Surgery

## 2023-04-23 ENCOUNTER — Encounter: Payer: BC Managed Care – PPO | Admitting: Surgery

## 2023-04-23 VITALS — BP 150/90 | HR 74 | Temp 98.0°F | Ht 63.0 in | Wt 142.0 lb

## 2023-04-23 DIAGNOSIS — Z09 Encounter for follow-up examination after completed treatment for conditions other than malignant neoplasm: Secondary | ICD-10-CM

## 2023-04-23 DIAGNOSIS — D0512 Intraductal carcinoma in situ of left breast: Secondary | ICD-10-CM

## 2023-04-23 NOTE — Therapy (Signed)
[facility] at [facility] [address], [address] [place], [place], [address] Phone: [phone]   Fax:  [phone]  Occupational Therapy Screen  Patient Details  Name: Ann Cox MRN: [id number] Date of Birth: 03-07-1976 No data recorded  Encounter Date: 04/23/2023   OT End of Session - 04/23/23 1728     Visit Number 0             Past Medical History:  Diagnosis Date   ACL (anterior cruciate ligament) rupture 03/10/2014   ACL sprain 02/2015   left   BRCA negative 11/2015   MyRisk BRCA/BART neg   Family history of ovarian cancer    H/O urinary retention    History of recurrent UTI (urinary tract infection)    Iron deficiency anemia    Medial meniscus tear 02/2015   left knee   Menorrhagia    Monoallelic mutation of ATM gene 69/6295   MyRisk testing; (increased risk of breast/pancreatic)   Ovarian cyst     Past Surgical History:  Procedure Laterality Date   ARTHROSCOPY WITH ANTERIOR CRUCIATE LIGAMENT (ACL) REPAIR WITH ANTERIOR TIBILIAS GRAFT Left 03/10/2015   Procedure: LEFT KNEE ARTHROSCOPY MEDIAL MENISCECTOMY  ANTERIOR CRUCIATE LIGAMENT (ACL) TIBIAL ANTERIOR ALLOGRAFT;  Surgeon: Teryl Lucy, MD;  Location: [facility];  Service: Orthopedics;  Laterality: Left;   BREAST BIOPSY Left 03/20/2023   Stereo bx, X-clip, path pending   BREAST BIOPSY Left 03/20/2023   MM LT BREAST BX W LOC DEV 1ST LESION IMAGE BX SPEC STEREO GUIDE 03/20/2023 [facility]   BREAST LUMPECTOMY WITH RADIOFREQUENCY TAG IDENTIFICATION Left 04/10/2023   Procedure: BREAST LUMPECTOMY WITH RADIOFREQUENCY TAG IDENTIFICATION;  Surgeon: Henrene Dodge, MD;  Location: [facility];  Service: General;  Laterality: Left;   KNEE ARTHROSCOPY WITH MEDIAL MENISECTOMY Left 03/10/2015   Procedure: KNEE ARTHROSCOPY WITH MEDIAL MENISECTOMY;  Surgeon: Teryl Lucy, MD;  Location: [facility];  Service: Orthopedics;  Laterality: Left;   ROBOTIC  ASSISTED TOTAL HYSTERECTOMY WITH BILATERAL SALPINGO OOPHERECTOMY Bilateral 02/10/2023   Procedure: XI ROBOTIC ASSISTED TOTAL LAPAROSCOPIC HYSTERECTOMY WITH BILATERAL SALPINGO OOPHORECTOMY;  Surgeon: Hildred Laser, MD;  Location: [facility];  Service: Gynecology;  Laterality: Bilateral;    There were no vitals filed for this visit.   Subjective Assessment - 04/23/23 1727     Subjective  Doing really good.  Had my lumpectomy 2nd May.  About 2 weeks.  I am meeting with radiation and oncology tomorrow to see with the plan.    Currently in Pain? No/denies               Surgeon appt 04/23/23 Assessment/Plan: This is a 47 y.o. female s/p left breast RF tag localized lumpectomy.   - Reviewed with the patient again pathology results showing DCIS without any evidence of invasive carcinoma. - Patient has appointment with Dr. Orlie Dakin and Dr. Rushie Chestnut tomorrow. - Patient to follow-up with me in about 6 months  OT SCREEN 04/23/23 Patient referred to OT for follow-up after lumpectomy.  Patient about 2 weeks postop.  Doing very well with active range of motion within normal limits. Patient to follow-up with Dr. Orlie Dakin and Dr. Aggie Cosier about radiation and chemo plan of care. Circumference within normal limits. Patient risk for lymphedema low. Patient to follow-up with me as needed.  Visit Diagnosis: Ductal carcinoma in situ (DCIS) of left breast    Problem List Patient Active Problem List   Diagnosis Date Noted   ATM gene mutation positive 04/08/2023   Ductal carcinoma in situ (DCIS) of left breast 03/25/2023   Endometriosis of fallopian tube 02/18/2023   Screening for HPV (human papillomavirus) 12/26/2022   Leiomyoma 12/26/2022   Menorrhagia with regular cycle 12/26/2022   Iron deficiency anemia 07/29/2019   Monoallelic mutation of ATM gene 60/45/4098   Abscess of breast 12/20/2016   ACL (anterior cruciate ligament)  rupture 03/10/2014   Smoker 02/17/2014   Left knee pain 02/16/2014   History of ovarian cyst 02/16/2014    Oletta Cohn, OTR/L,CLT 04/23/2023, 5:28 PM  [facility] at [facility] [address], [address] [place], [place], [address] Phone: [phone]   Fax:  [phone]  Name: Ann Cox MRN: [id number] Date of Birth: 1976/04/10

## 2023-04-23 NOTE — Progress Notes (Signed)
04/23/2023  HPI: Ann Cox is a 47 y.o. female s/p left breast RF tag localized lumpectomy on 04/10/2023.  Patient presents today for follow-up.  5 pathology results showed her mass was consistent with DCIS with clear margins and no evidence of invasive carcinoma.  Patient reports that she has been doing well.  Initially had some soreness at the incision itself which has been improving.  More recently however she has been noticing discomfort in the left axilla and left subcostal areas.  She thinks this may be related to the compression bra that she is wearing.  Vital signs: BP (!) 150/90   Pulse 74   Temp 98 F (36.7 C)   Ht 5\' 3"  (1.6 m)   Wt 142 lb (64.4 kg)   LMP 12/15/2021 (Approximate)   SpO2 98%   BMI 25.15 kg/m    Physical Exam: Constitutional: No acute distress Breast: Left breast lumpectomy in the upper outer quadrant healing well with incision clean, dry, intact.  Dermabond is peeling off appropriately.  There is bruising at the lower outer quadrant towards the bottom portion of the incision.  Otherwise no lymphadenopathy or any other concerns.  Assessment/Plan: This is a 47 y.o. female s/p left breast RF tag localized lumpectomy.  - Reviewed with the patient again pathology results showing DCIS without any evidence of invasive carcinoma. - Patient has appointment with Dr. Orlie Dakin and Dr. Rushie Chestnut tomorrow. - Patient to follow-up with me in about 6 months   Howie Ill, MD [facility]

## 2023-04-23 NOTE — Patient Instructions (Addendum)
See Oncology as scheduled.  Follow up here in 6 months for a recheck. We will send you a letter about this appointment.  You may try using some padding in the area that is more sore.   May rub Vitamin-E oil or other emmolient agent in area 2-3 times a day to soften. You will need to use sunscreen for the next year on the area to minimize altered pigmentation of the site.    Please call and ask to speak with a nurse if you develop questions or concerns.

## 2023-04-24 ENCOUNTER — Encounter: Payer: Self-pay | Admitting: Oncology

## 2023-04-24 ENCOUNTER — Ambulatory Visit
Admission: RE | Admit: 2023-04-24 | Discharge: 2023-04-24 | Disposition: A | Discharge status: Home / Self Care | Payer: BC Managed Care – PPO | Source: Ambulatory Visit | Attending: Radiation Oncology | Admitting: Radiation Oncology

## 2023-04-24 ENCOUNTER — Inpatient Hospital Stay (HOSPITAL_BASED_OUTPATIENT_CLINIC_OR_DEPARTMENT_OTHER): Payer: BC Managed Care – PPO | Admitting: Oncology

## 2023-04-24 VITALS — BP 156/103 | HR 88 | Temp 97.3°F | Resp 16 | Ht 63.0 in | Wt 142.0 lb

## 2023-04-24 DIAGNOSIS — Z170 Estrogen receptor positive status [ER+]: Secondary | ICD-10-CM | POA: Insufficient documentation

## 2023-04-24 DIAGNOSIS — Z803 Family history of malignant neoplasm of breast: Secondary | ICD-10-CM | POA: Insufficient documentation

## 2023-04-24 DIAGNOSIS — D0512 Intraductal carcinoma in situ of left breast: Secondary | ICD-10-CM | POA: Insufficient documentation

## 2023-04-24 DIAGNOSIS — Z1501 Genetic susceptibility to malignant neoplasm of breast: Secondary | ICD-10-CM | POA: Diagnosis not present

## 2023-04-24 DIAGNOSIS — Z801 Family history of malignant neoplasm of trachea, bronchus and lung: Secondary | ICD-10-CM | POA: Insufficient documentation

## 2023-04-24 DIAGNOSIS — Z79899 Other long term (current) drug therapy: Secondary | ICD-10-CM | POA: Insufficient documentation

## 2023-04-24 DIAGNOSIS — Z1502 Genetic susceptibility to malignant neoplasm of ovary: Secondary | ICD-10-CM | POA: Diagnosis not present

## 2023-04-24 DIAGNOSIS — D509 Iron deficiency anemia, unspecified: Secondary | ICD-10-CM | POA: Insufficient documentation

## 2023-04-24 DIAGNOSIS — Z1509 Genetic susceptibility to other malignant neoplasm: Secondary | ICD-10-CM | POA: Diagnosis not present

## 2023-04-24 NOTE — Consult Note (Signed)
NEW PATIENT EVALUATION  Name: Ann Cox  MRN: [id number]  Date:   04/24/2023     DOB: [birth year]   This 47 y.o. 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  REFERRING PHYSICIAN: Jeralyn Ruths, MD  CHIEF COMPLAINT:  Chief Complaint  Patient presents with   Breast Cancer    Consult    DIAGNOSIS: The encounter diagnosis was Ductal carcinoma in situ (DCIS) of left breast.   PREVIOUS INVESTIGATIONS:  Mammogram and ultrasound reviewed Clinical notes reviewed Pathology reports reviewed  HPI: Patient is a 47 year old female who presented with an abnormal mammogram.  There was a 2 cm group of pleomorphic calcifications in the central portion mid depth indeterminate for malignancy.  She underwent stereotactic guided biopsy which was positive for ductal carcinoma in situ.  Patient underwent wide local excision for a 1.1 cm area of grade 3 ductal carcinoma in situ with comedonecrosis present.  Margins were clear at 2 mm.  No regional lymph nodes were sampled.  Patient has done well postoperatively.  She seen medical oncology and will be recommended for endocrine therapy after completion of radiation.  She still having some tenderness in her left axilla although she did not receive any sentinel node biopsy.  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    PAST SURGICAL HISTORY:  Past Surgical History:  Procedure Laterality Date    ARTHROSCOPY WITH ANTERIOR CRUCIATE LIGAMENT (ACL) REPAIR WITH ANTERIOR TIBILIAS GRAFT Left 03/10/2015   Procedure: LEFT KNEE ARTHROSCOPY MEDIAL MENISCECTOMY  ANTERIOR CRUCIATE LIGAMENT (ACL) TIBIAL ANTERIOR ALLOGRAFT;  Surgeon: Teryl Lucy, MD;  Location: [facility];  Service: Orthopedics;  Laterality: Left;   BREAST BIOPSY Left 03/20/2023   Stereo bx, X-clip, path pending   BREAST BIOPSY Left 03/20/2023   MM LT BREAST BX W LOC DEV 1ST LESION IMAGE BX SPEC STEREO GUIDE 03/20/2023 [facility]   BREAST LUMPECTOMY WITH RADIOFREQUENCY TAG IDENTIFICATION Left 04/10/2023   Procedure: BREAST LUMPECTOMY WITH RADIOFREQUENCY TAG IDENTIFICATION;  Surgeon: Henrene Dodge, MD;  Location: [facility];  Service: General;  Laterality: Left;   KNEE ARTHROSCOPY WITH MEDIAL MENISECTOMY Left 03/10/2015   Procedure: KNEE ARTHROSCOPY WITH MEDIAL MENISECTOMY;  Surgeon: Teryl Lucy, MD;  Location: [facility];  Service: Orthopedics;  Laterality: Left;   ROBOTIC ASSISTED TOTAL HYSTERECTOMY WITH BILATERAL SALPINGO OOPHERECTOMY Bilateral 02/10/2023   Procedure: XI ROBOTIC ASSISTED TOTAL LAPAROSCOPIC HYSTERECTOMY WITH BILATERAL SALPINGO OOPHORECTOMY;  Surgeon: Hildred Laser, MD;  Location: [facility];  Service: Gynecology;  Laterality: Bilateral;    FAMILY HISTORY: family history includes Alcohol abuse in her maternal uncle and paternal uncle; Anxiety disorder in her maternal aunt, maternal grandmother, and mother; Arthritis in her father, maternal grandmother, and mother; Breast cancer in an other family member; Heart disease in her maternal grandmother; Hyperlipidemia in her father and maternal grandfather; Hypertension in her father and mother; Lung cancer in her maternal grandfather; Ovarian cancer in her maternal aunt; Rheum arthritis in her paternal uncle. She was adopted.  SOCIAL HISTORY:  reports that she has been  smoking cigarettes. She has a 7.00 pack-year smoking history. She has been  exposed to tobacco smoke. She has never used smokeless tobacco. She reports current alcohol use of about 3.0 standard drinks of alcohol per week. She reports that she does not use drugs.  ALLERGIES: Patient has no known allergies.  MEDICATIONS:  Current Outpatient Medications  Medication Sig Dispense Refill   acetaminophen (TYLENOL) 500 MG tablet Take 2 tablets (1,000 mg total) by mouth every 6 (six) hours as needed for mild pain.     docusate sodium (COLACE) 100 MG capsule Take 1 capsule (100 mg total) by mouth 2 (two) times daily as needed. 30 capsule 2   estrogens, conjugated, (PREMARIN) 0.625 MG tablet Take 1 tablet (0.625 mg total) by mouth daily. Take daily for 21 days then do not take for 7 days. 90 tablet 3   ibuprofen (ADVIL) 800 MG tablet Take 1 tablet (800 mg total) by mouth every 8 (eight) hours as needed for moderate pain. 60 tablet 1   ondansetron (ZOFRAN) 4 MG tablet Take 1 tablet (4 mg total) by mouth every 8 (eight) hours as needed for nausea or vomiting. 20 tablet 1   oxyCODONE (OXY IR/ROXICODONE) 5 MG immediate release tablet Take 1 tablet (5 mg total) by mouth every 4 (four) hours as needed for severe pain. 30 tablet 0   simethicone (GAS-X) 80 MG chewable tablet Chew 1 tablet (80 mg total) by mouth 4 (four) times daily as needed for flatulence. 30 tablet 2   ZUBSOLV 2.9-0.71 MG SUBL Take 1.5 tablets by mouth every morning.     No current facility-administered medications for this encounter.    ECOG PERFORMANCE STATUS:  0 - Asymptomatic  REVIEW OF SYSTEMS: Patient recently had TAH/BSO with negative findings for malignancy. Patient denies any weight loss, fatigue, weakness, fever, chills or night sweats. Patient denies any loss of vision, blurred vision. Patient denies any ringing  of the ears or hearing loss. No irregular heartbeat. Patient denies heart murmur or history of fainting. Patient denies any chest pain or pain radiating to her upper extremities. Patient denies any  shortness of breath, difficulty breathing at night, cough or hemoptysis. Patient denies any swelling in the lower legs. Patient denies any nausea vomiting, vomiting of blood, or coffee ground material in the vomitus. Patient denies any stomach pain. Patient states has had normal bowel movements no significant constipation or diarrhea. Patient denies any dysuria, hematuria or significant nocturia. Patient denies any problems walking, swelling in the joints or loss of balance. Patient denies any skin changes, loss of hair or loss of weight. Patient denies any excessive worrying or anxiety or significant depression. Patient denies any problems with insomnia. Patient denies excessive thirst, polyuria, polydipsia. Patient denies any swollen glands, patient denies easy bruising or easy bleeding. Patient denies any recent infections, allergies or URI. Patient "s visual fields have not changed significantly in recent time.   PHYSICAL EXAM: LMP 12/15/2021 (Approximate)  Left breast has a wide local excision scar which is well-healed.  No dominant masses noted in either breast no axillary or supraclavicular adenopathy is appreciated.  Well-developed well-nourished patient in NAD. HEENT reveals PERLA, EOMI, discs not visualized.  Oral cavity is clear. No oral mucosal lesions are identified. Neck is clear without evidence of cervical or supraclavicular adenopathy. Lungs are clear to A&P. Cardiac examination is essentially unremarkable with regular rate and rhythm without murmur rub or thrill. Abdomen is benign with no organomegaly or masses noted. Motor sensory and DTR  levels are equal and symmetric in the upper and lower extremities. Cranial nerves II through XII are grossly intact. Proprioception is intact. No peripheral adenopathy or edema is identified. No motor or sensory levels are noted. Crude visual fields are within normal range.  LABORATORY DATA: Pathology reports reviewed    RADIOLOGY RESULTS: Anagrams  ultrasound reviewed compatible with above-stated findings   IMPRESSION: ER positive ductal carcinoma in situ of the left breast status post wide local excision in 47 year old female  PLAN: At this time elect to go ahead with hypofractionated whole breast radiation to her left breast.  Will treat that in 3 weeks and boost her scar another 1000 centigrade using a photon boost.  Will use deep inspiration breath-hold technique to spare her cardiac tissue.  Risks and benefits of treatment including skin reaction fatigue alteration of blood counts possible inclusion of superficial lung all were reviewed in detail with the patient.  I have personally set up and ordered CT simulation for next week.  Patient comprehends my recommendations well.  She will be a candidate for endocrine therapy after completion of radiation.  I would like to take this opportunity to thank you for allowing me to participate in the care of your patient.Carmina Miller, MD

## 2023-04-24 NOTE — Progress Notes (Signed)
States she is having soreness under L axilla and shoulder. Wondered if it could be related to the surgery done.

## 2023-04-24 NOTE — Progress Notes (Signed)
[facility]  Telephone:([phone] [phone]) [phone]  ID: Ann Urness OB: 1976/10/05  MR#: [id number]  WUX#:[id number]  Patient Care Team: Patient, No Pcp Per as PCP - General (General Practice) Nadara Mustard, MD as Referring Physician (Obstetrics and Gynecology) Lemar Livings, Merrily Pew, MD (General Surgery) Hulen Luster, RN as Oncology Nurse Navigator  CHIEF COMPLAINT: DCIS left breast.  INTERVAL HISTORY: Patient returns to clinic today for further evaluation, discussion of her final pathology results, and treatment planning.  She currently feels well and is asymptomatic.  She has residual tenderness at her surgical site.  She has no neurologic complaints.  She denies any recent fevers or illnesses.  She has a good appetite and denies weight loss.  She has no chest pain, shortness of breath, cough, or hemoptysis.  She denies any nausea, vomiting, constipation, or diarrhea.  She has no urinary complaints.  Patient offers no further specific complaints today.  REVIEW OF SYSTEMS:   Review of Systems  Constitutional: Negative.  Negative for fever, malaise/fatigue and weight loss.  Respiratory: Negative.  Negative for cough, hemoptysis and shortness of breath.   Cardiovascular: Negative.  Negative for chest pain and leg swelling.  Gastrointestinal: Negative.  Negative for abdominal pain.  Genitourinary: Negative.  Negative for dysuria.  Musculoskeletal: Negative.  Negative for back pain.  Skin: Negative.  Negative for rash.  Neurological: Negative.  Negative for dizziness, focal weakness, weakness and headaches.  Psychiatric/Behavioral: Negative.  The patient is not nervous/anxious.     As per HPI. Otherwise, a complete review of systems is negative.  PAST MEDICAL HISTORY: Past Medical History:  Diagnosis Date   ACL (anterior cruciate ligament) rupture 03/10/2014   ACL sprain 02/2015   left   BRCA negative 11/2015   MyRisk BRCA/BART neg   Family history of  ovarian cancer    H/O urinary retention    History of recurrent UTI (urinary tract infection)    Iron deficiency anemia    Medial meniscus tear 02/2015   left knee   Menorrhagia    Monoallelic mutation of ATM gene 25/3664   MyRisk testing; (increased risk of breast/pancreatic)   Ovarian cyst     PAST SURGICAL HISTORY: Past Surgical History:  Procedure Laterality Date   ARTHROSCOPY WITH ANTERIOR CRUCIATE LIGAMENT (ACL) REPAIR WITH ANTERIOR TIBILIAS GRAFT Left 03/10/2015   Procedure: LEFT KNEE ARTHROSCOPY MEDIAL MENISCECTOMY  ANTERIOR CRUCIATE LIGAMENT (ACL) TIBIAL ANTERIOR ALLOGRAFT;  Surgeon: Teryl Lucy, MD;  Location: [facility];  Service: Orthopedics;  Laterality: Left;   BREAST BIOPSY Left 03/20/2023   Stereo bx, X-clip, path pending   BREAST BIOPSY Left 03/20/2023   MM LT BREAST BX W LOC DEV 1ST LESION IMAGE BX SPEC STEREO GUIDE 03/20/2023 [facility]   BREAST LUMPECTOMY WITH RADIOFREQUENCY TAG IDENTIFICATION Left 04/10/2023   Procedure: BREAST LUMPECTOMY WITH RADIOFREQUENCY TAG IDENTIFICATION;  Surgeon: Henrene Dodge, MD;  Location: [facility];  Service: General;  Laterality: Left;   KNEE ARTHROSCOPY WITH MEDIAL MENISECTOMY Left 03/10/2015   Procedure: KNEE ARTHROSCOPY WITH MEDIAL MENISECTOMY;  Surgeon: Teryl Lucy, MD;  Location: [facility];  Service: Orthopedics;  Laterality: Left;   ROBOTIC ASSISTED TOTAL HYSTERECTOMY WITH BILATERAL SALPINGO OOPHERECTOMY Bilateral 02/10/2023   Procedure: XI ROBOTIC ASSISTED TOTAL LAPAROSCOPIC HYSTERECTOMY WITH BILATERAL SALPINGO OOPHORECTOMY;  Surgeon: Hildred Laser, MD;  Location: [facility];  Service: Gynecology;  Laterality: Bilateral;    FAMILY HISTORY: Family History  Adopted: Yes  Problem Relation Age of Onset   Arthritis Mother  Hypertension Mother    Anxiety disorder Mother    Arthritis Father    Hyperlipidemia Father    Hypertension Father    Anxiety disorder Maternal Aunt    Ovarian cancer  Maternal Aunt        dx 63s   Alcohol abuse Maternal Uncle    Alcohol abuse Paternal Uncle    Rheum arthritis Paternal Uncle    Arthritis Maternal Grandmother    Heart disease Maternal Grandmother    Anxiety disorder Maternal Grandmother    Hyperlipidemia Maternal Grandfather    Lung cancer Maternal Grandfather    Breast cancer Other        paternal great aunt    ADVANCED DIRECTIVES (Y/N):  N  HEALTH MAINTENANCE: Social History   Tobacco Use   Smoking status: Every Day    Packs/day: 0.50    Years: 14.00    Additional pack years: 0.00    Total pack years: 7.00    Types: Cigarettes    Passive exposure: Past   Smokeless tobacco: Never  Vaping Use   Vaping Use: Never used  Substance Use Topics   Alcohol use: Yes    Alcohol/week: 3.0 standard drinks of alcohol    Types: 3 Glasses of wine per week    Comment: occasionally   Drug use: No     Colonoscopy:  PAP:  Bone density:  Lipid panel:  No Known Allergies  Current Outpatient Medications  Medication Sig Dispense Refill   acetaminophen (TYLENOL) 500 MG tablet Take 2 tablets (1,000 mg total) by mouth every 6 (six) hours as needed for mild pain.     docusate sodium (COLACE) 100 MG capsule Take 1 capsule (100 mg total) by mouth 2 (two) times daily as needed. 30 capsule 2   estrogens, conjugated, (PREMARIN) 0.625 MG tablet Take 1 tablet (0.625 mg total) by mouth daily. Take daily for 21 days then do not take for 7 days. 90 tablet 3   ibuprofen (ADVIL) 800 MG tablet Take 1 tablet (800 mg total) by mouth every 8 (eight) hours as needed for moderate pain. 60 tablet 1   ondansetron (ZOFRAN) 4 MG tablet Take 1 tablet (4 mg total) by mouth every 8 (eight) hours as needed for nausea or vomiting. 20 tablet 1   oxyCODONE (OXY IR/ROXICODONE) 5 MG immediate release tablet Take 1 tablet (5 mg total) by mouth every 4 (four) hours as needed for severe pain. 30 tablet 0   simethicone (GAS-X) 80 MG chewable tablet Chew 1 tablet (80 mg  total) by mouth 4 (four) times daily as needed for flatulence. 30 tablet 2   ZUBSOLV 2.9-0.71 MG SUBL Take 1.5 tablets by mouth every morning.     No current facility-administered medications for this visit.    OBJECTIVE: Vitals:   04/24/23 1012  BP: (!) 156/103  Pulse: 88  Resp: 16  Temp: (!) 97.3 F (36.3 C)  SpO2: 99%     Body mass index is 25.15 kg/m.    ECOG FS:0 - Asymptomatic  General: Well-developed, well-nourished, no acute distress. Eyes: Pink conjunctiva, anicteric sclera. HEENT: Normocephalic, moist mucous membranes. Lungs: No audible wheezing or coughing. Heart: Regular rate and rhythm. Abdomen: Soft, nontender, no obvious distention. Musculoskeletal: No edema, cyanosis, or clubbing. Neuro: Alert, answering all questions appropriately. Cranial nerves grossly intact. Skin: No rashes or petechiae noted. Psych: Normal affect.  LAB RESULTS:  Lab Results  Component Value Date   NA 138 02/10/2023   K 4.0 02/10/2023   CL  105 02/10/2023   CO2 24 11/18/2022   GLUCOSE 98 02/10/2023   BUN 9 02/10/2023   CREATININE 0.60 02/10/2023   CALCIUM 9.2 11/18/2022   PROT 7.6 11/18/2022   ALBUMIN 4.1 11/18/2022   AST 15 11/18/2022   ALT 9 11/18/2022   ALKPHOS 71 11/18/2022   BILITOT 0.5 11/18/2022   GFRNONAA >60 11/18/2022   GFRAA >60 11/04/2019    Lab Results  Component Value Date   WBC 5.9 02/05/2023   NEUTROABS 3.8 11/19/2022   HGB 13.3 02/10/2023   HCT 39.0 02/10/2023   MCV 79.1 (L) 02/05/2023   PLT 220 02/05/2023     STUDIES: MM Breast Surgical Specimen  Result Date: 04/10/2023 CLINICAL DATA:  Status post lumpectomy for left breast ductal carcinoma in-situ EXAM: SPECIMEN RADIOGRAPH OF THE LEFT BREAST COMPARISON:  Previous exam(s). FINDINGS: Status post excision of the left breast. The radioactive seed and X-shaped biopsy marker clip are present, completely intact, and were marked for pathology. IMPRESSION: Specimen radiograph of the left breast.  Electronically Signed   By: Jacob Moores M.D.   On: 04/10/2023 08:44  MM LT RADIO FREQUENCY TAG LOC MAMMO GUIDE  Result Date: 04/01/2023 CLINICAL DATA:  47 year old female presents for RF tag localization of LEFT breast DCIS. EXAM: MAMMOGRAPHIC GUIDED RADIOFREQUENCY DEVICE LOCALIZATION OF THE LEFT BREAST COMPARISON:  Previous exam(s). FINDINGS: Patient presents for radiofrequency device localization prior to LEFT lumpectomy. I met with the patient and we discussed the procedure of radiofrequency device localization including benefits and alternatives. We discussed the high likelihood of a successful procedure. We discussed the risks of the procedure including infection, bleeding, tissue injury and further surgery. Informed, written consent was given. The usual time-out protocol was performed immediately prior to the procedure. Using mammographic guidance, sterile technique, 1% lidocaine as local anesthesia, a radiofrequency tag was used to localize DCIS calcifications and X biopsy clip using a MEDIAL approach. The follow-up mammogram images confirm that the RF device is in the expected location with visible DCIS calcifications extending from the RF tag as follows: 10 mm anterior to the tag 8 mm inferior to the tag 5 mm posterior to the tag 5 mm superior to the tag 10 mm lateral to the tag 5 mm medial to the tack Follow-up survey of the patient confirms the presence of the RF device. The patient tolerated the procedure well and was released from the [facility]. IMPRESSION: Radiofrequency device localization of the LEFT breast. DCIS calcifications extending from the RF tag as described above. No apparent complications. Electronically Signed   By: Harmon Pier M.D.   On: 04/01/2023 16:41   ASSESSMENT: DCIS left breast.  PLAN:    DCIS left breast: Patient underwent left breast lumpectomy on Apr 10, 2023 confirming diagnosis.  Given the noninvasive nature of her disease, she did not require chemotherapy.   Patient has an appointment with radiation oncology today to discuss adjuvant XRT.  She will benefit from prophylactic tamoxifen for minimum of 5 years at the conclusion of her treatments.  Return to clinic at the end of her treatments for further evaluation and initiation of tamoxifen. ATM gene mutation: Appreciate genetics input.  Patient has undergone total hysterectomy.  Continue mammograms as recommended.  I spent a total of 30 minutes reviewing chart data, face-to-face evaluation with the patient, counseling and coordination of care as detailed above.    Patient expressed understanding and was in agreement with this plan. She also understands that She can call clinic at any time  with any questions, concerns, or complaints.    Cancer Staging  Ductal carcinoma in situ (DCIS) of left breast Staging form: Breast, AJCC 8th Edition - Clinical stage from 03/25/2023: Stage 0 (cTis (DCIS), cN0, cM0) - Signed by Jeralyn Ruths, MD on 03/25/2023 Stage prefix: Initial diagnosis Nuclear grade: G3   Jeralyn Ruths, MD   04/24/2023 1:26 PM

## 2023-04-25 ENCOUNTER — Encounter: Payer: Self-pay | Admitting: Licensed Clinical Social Worker

## 2023-04-25 NOTE — Progress Notes (Signed)
[facility] Clinical Social Work  Clinical Social Work was referred by medical provider for assessment of psychosocial needs.  Clinical Social Worker attempted to contact patient by phone to offer support and assess for needs.  CSW unable to leave voicemail, mailbox full.   FA  Joseph Art, LCSW  Clinical Social Worker [facility]

## 2023-04-28 ENCOUNTER — Ambulatory Visit
Admission: RE | Admit: 2023-04-28 | Discharge: 2023-04-28 | Disposition: A | Discharge status: Home / Self Care | Payer: BC Managed Care – PPO | Source: Ambulatory Visit | Attending: Radiation Oncology | Admitting: Radiation Oncology

## 2023-04-28 ENCOUNTER — Encounter: Payer: Self-pay | Admitting: *Deleted

## 2023-04-28 DIAGNOSIS — D0512 Intraductal carcinoma in situ of left breast: Secondary | ICD-10-CM | POA: Diagnosis not present

## 2023-04-28 DIAGNOSIS — Z510 Encounter for antineoplastic radiation therapy: Secondary | ICD-10-CM | POA: Diagnosis not present

## 2023-04-28 DIAGNOSIS — Z170 Estrogen receptor positive status [ER+]: Secondary | ICD-10-CM | POA: Diagnosis not present

## 2023-04-29 DIAGNOSIS — D0512 Intraductal carcinoma in situ of left breast: Secondary | ICD-10-CM | POA: Diagnosis not present

## 2023-04-29 DIAGNOSIS — F1120 Opioid dependence, uncomplicated: Secondary | ICD-10-CM | POA: Diagnosis not present

## 2023-04-29 DIAGNOSIS — F411 Generalized anxiety disorder: Secondary | ICD-10-CM | POA: Diagnosis not present

## 2023-04-29 DIAGNOSIS — Z170 Estrogen receptor positive status [ER+]: Secondary | ICD-10-CM | POA: Diagnosis not present

## 2023-04-29 DIAGNOSIS — F1110 Opioid abuse, uncomplicated: Secondary | ICD-10-CM | POA: Diagnosis not present

## 2023-04-29 DIAGNOSIS — Z510 Encounter for antineoplastic radiation therapy: Secondary | ICD-10-CM | POA: Diagnosis not present

## 2023-04-29 DIAGNOSIS — Z7151 Drug abuse counseling and surveillance of drug abuser: Secondary | ICD-10-CM | POA: Diagnosis not present

## 2023-05-02 ENCOUNTER — Other Ambulatory Visit: Payer: Self-pay | Admitting: *Deleted

## 2023-05-02 DIAGNOSIS — D0512 Intraductal carcinoma in situ of left breast: Secondary | ICD-10-CM

## 2023-05-06 ENCOUNTER — Ambulatory Visit: Admission: RE | Admit: 2023-05-06 | Payer: BC Managed Care – PPO | Source: Ambulatory Visit

## 2023-05-06 DIAGNOSIS — Z510 Encounter for antineoplastic radiation therapy: Secondary | ICD-10-CM | POA: Diagnosis not present

## 2023-05-06 DIAGNOSIS — Z170 Estrogen receptor positive status [ER+]: Secondary | ICD-10-CM | POA: Diagnosis not present

## 2023-05-06 DIAGNOSIS — D0512 Intraductal carcinoma in situ of left breast: Secondary | ICD-10-CM | POA: Diagnosis not present

## 2023-05-07 ENCOUNTER — Other Ambulatory Visit: Payer: Self-pay

## 2023-05-07 ENCOUNTER — Ambulatory Visit
Admission: RE | Admit: 2023-05-07 | Discharge: 2023-05-07 | Disposition: A | Discharge status: Home / Self Care | Payer: BC Managed Care – PPO | Source: Ambulatory Visit | Attending: Radiation Oncology | Admitting: Radiation Oncology

## 2023-05-07 DIAGNOSIS — Z170 Estrogen receptor positive status [ER+]: Secondary | ICD-10-CM | POA: Diagnosis not present

## 2023-05-07 DIAGNOSIS — Z510 Encounter for antineoplastic radiation therapy: Secondary | ICD-10-CM | POA: Diagnosis not present

## 2023-05-07 DIAGNOSIS — D0512 Intraductal carcinoma in situ of left breast: Secondary | ICD-10-CM | POA: Diagnosis not present

## 2023-05-07 LAB — RAD ONC ARIA SESSION SUMMARY
Course Elapsed Days: 0
Plan Fractions Treated to Date: 1
Plan Prescribed Dose Per Fraction: 2.66 Gy
Plan Total Fractions Prescribed: 16
Plan Total Prescribed Dose: 42.56 Gy
Reference Point Dosage Given to Date: 2.66 Gy
Reference Point Session Dosage Given: 2.66 Gy
Session Number: 1

## 2023-05-08 ENCOUNTER — Other Ambulatory Visit: Payer: Self-pay

## 2023-05-08 ENCOUNTER — Ambulatory Visit
Admission: RE | Admit: 2023-05-08 | Discharge: 2023-05-08 | Disposition: A | Discharge status: Home / Self Care | Payer: BC Managed Care – PPO | Source: Ambulatory Visit | Attending: Radiation Oncology | Admitting: Radiation Oncology

## 2023-05-08 DIAGNOSIS — Z510 Encounter for antineoplastic radiation therapy: Secondary | ICD-10-CM | POA: Diagnosis not present

## 2023-05-08 DIAGNOSIS — Z170 Estrogen receptor positive status [ER+]: Secondary | ICD-10-CM | POA: Diagnosis not present

## 2023-05-08 DIAGNOSIS — D0512 Intraductal carcinoma in situ of left breast: Secondary | ICD-10-CM | POA: Diagnosis not present

## 2023-05-08 LAB — RAD ONC ARIA SESSION SUMMARY
Course Elapsed Days: 1
Plan Fractions Treated to Date: 2
Plan Prescribed Dose Per Fraction: 2.66 Gy
Plan Total Fractions Prescribed: 16
Plan Total Prescribed Dose: 42.56 Gy
Reference Point Dosage Given to Date: 5.32 Gy
Reference Point Session Dosage Given: 2.66 Gy
Session Number: 2

## 2023-05-09 ENCOUNTER — Other Ambulatory Visit: Payer: Self-pay

## 2023-05-09 ENCOUNTER — Ambulatory Visit
Admission: RE | Admit: 2023-05-09 | Discharge: 2023-05-09 | Disposition: A | Discharge status: Home / Self Care | Payer: BC Managed Care – PPO | Source: Ambulatory Visit | Attending: Radiation Oncology | Admitting: Radiation Oncology

## 2023-05-09 DIAGNOSIS — D0512 Intraductal carcinoma in situ of left breast: Secondary | ICD-10-CM | POA: Diagnosis not present

## 2023-05-09 DIAGNOSIS — Z170 Estrogen receptor positive status [ER+]: Secondary | ICD-10-CM | POA: Diagnosis not present

## 2023-05-09 DIAGNOSIS — Z510 Encounter for antineoplastic radiation therapy: Secondary | ICD-10-CM | POA: Diagnosis not present

## 2023-05-09 LAB — RAD ONC ARIA SESSION SUMMARY
Course Elapsed Days: 2
Plan Fractions Treated to Date: 3
Plan Prescribed Dose Per Fraction: 2.66 Gy
Plan Total Fractions Prescribed: 16
Plan Total Prescribed Dose: 42.56 Gy
Reference Point Dosage Given to Date: 7.98 Gy
Reference Point Session Dosage Given: 2.66 Gy
Session Number: 3

## 2023-05-12 ENCOUNTER — Ambulatory Visit
Admission: RE | Admit: 2023-05-12 | Discharge: 2023-05-12 | Disposition: A | Discharge status: Home / Self Care | Payer: BC Managed Care – PPO | Source: Ambulatory Visit | Attending: Radiation Oncology | Admitting: Radiation Oncology

## 2023-05-12 ENCOUNTER — Encounter: Payer: Self-pay | Admitting: *Deleted

## 2023-05-12 ENCOUNTER — Other Ambulatory Visit: Payer: Self-pay

## 2023-05-12 ENCOUNTER — Inpatient Hospital Stay: Payer: BC Managed Care – PPO | Attending: Oncology

## 2023-05-12 DIAGNOSIS — D0512 Intraductal carcinoma in situ of left breast: Secondary | ICD-10-CM | POA: Insufficient documentation

## 2023-05-12 DIAGNOSIS — Z510 Encounter for antineoplastic radiation therapy: Secondary | ICD-10-CM | POA: Insufficient documentation

## 2023-05-12 DIAGNOSIS — Z170 Estrogen receptor positive status [ER+]: Secondary | ICD-10-CM | POA: Diagnosis not present

## 2023-05-12 LAB — CBC (CANCER CENTER ONLY)
HCT: 44.6 % (ref 36.0–46.0)
Hemoglobin: 15.1 g/dL — ABNORMAL HIGH (ref 12.0–15.0)
MCH: 29.7 pg (ref 26.0–34.0)
MCHC: 33.9 g/dL (ref 30.0–36.0)
MCV: 87.8 fL (ref 80.0–100.0)
Platelet Count: 153 10*3/uL (ref 150–400)
RBC: 5.08 MIL/uL (ref 3.87–5.11)
RDW: 14.5 % (ref 11.5–15.5)
WBC Count: 5.7 10*3/uL (ref 4.0–10.5)
nRBC: 0 % (ref 0.0–0.2)

## 2023-05-12 LAB — RAD ONC ARIA SESSION SUMMARY
Course Elapsed Days: 5
Plan Fractions Treated to Date: 4
Plan Prescribed Dose Per Fraction: 2.66 Gy
Plan Total Fractions Prescribed: 16
Plan Total Prescribed Dose: 42.56 Gy
Reference Point Dosage Given to Date: 10.64 Gy
Reference Point Session Dosage Given: 2.66 Gy
Session Number: 4

## 2023-05-13 ENCOUNTER — Ambulatory Visit
Admission: RE | Admit: 2023-05-13 | Discharge: 2023-05-13 | Disposition: A | Discharge status: Home / Self Care | Payer: BC Managed Care – PPO | Source: Ambulatory Visit | Attending: Radiation Oncology | Admitting: Radiation Oncology

## 2023-05-13 ENCOUNTER — Other Ambulatory Visit: Payer: Self-pay

## 2023-05-13 DIAGNOSIS — Z170 Estrogen receptor positive status [ER+]: Secondary | ICD-10-CM | POA: Diagnosis not present

## 2023-05-13 DIAGNOSIS — D0512 Intraductal carcinoma in situ of left breast: Secondary | ICD-10-CM | POA: Diagnosis not present

## 2023-05-13 DIAGNOSIS — Z510 Encounter for antineoplastic radiation therapy: Secondary | ICD-10-CM | POA: Diagnosis not present

## 2023-05-13 LAB — RAD ONC ARIA SESSION SUMMARY
Course Elapsed Days: 6
Plan Fractions Treated to Date: 5
Plan Prescribed Dose Per Fraction: 2.66 Gy
Plan Total Fractions Prescribed: 16
Plan Total Prescribed Dose: 42.56 Gy
Reference Point Dosage Given to Date: 13.3 Gy
Reference Point Session Dosage Given: 2.66 Gy
Session Number: 5

## 2023-05-14 ENCOUNTER — Ambulatory Visit
Admission: RE | Admit: 2023-05-14 | Discharge: 2023-05-14 | Disposition: A | Discharge status: Home / Self Care | Payer: BC Managed Care – PPO | Source: Ambulatory Visit | Attending: Radiation Oncology | Admitting: Radiation Oncology

## 2023-05-14 ENCOUNTER — Other Ambulatory Visit: Payer: Self-pay

## 2023-05-14 DIAGNOSIS — D0512 Intraductal carcinoma in situ of left breast: Secondary | ICD-10-CM | POA: Diagnosis not present

## 2023-05-14 DIAGNOSIS — Z170 Estrogen receptor positive status [ER+]: Secondary | ICD-10-CM | POA: Diagnosis not present

## 2023-05-14 DIAGNOSIS — Z510 Encounter for antineoplastic radiation therapy: Secondary | ICD-10-CM | POA: Diagnosis not present

## 2023-05-14 LAB — RAD ONC ARIA SESSION SUMMARY
Course Elapsed Days: 7
Plan Fractions Treated to Date: 6
Plan Prescribed Dose Per Fraction: 2.66 Gy
Plan Total Fractions Prescribed: 16
Plan Total Prescribed Dose: 42.56 Gy
Reference Point Dosage Given to Date: 15.96 Gy
Reference Point Session Dosage Given: 2.66 Gy
Session Number: 6

## 2023-05-15 ENCOUNTER — Ambulatory Visit
Admission: RE | Admit: 2023-05-15 | Discharge: 2023-05-15 | Disposition: A | Discharge status: Home / Self Care | Payer: BC Managed Care – PPO | Source: Ambulatory Visit | Attending: Radiation Oncology | Admitting: Radiation Oncology

## 2023-05-15 ENCOUNTER — Other Ambulatory Visit: Payer: Self-pay

## 2023-05-15 DIAGNOSIS — Z510 Encounter for antineoplastic radiation therapy: Secondary | ICD-10-CM | POA: Diagnosis not present

## 2023-05-15 DIAGNOSIS — Z170 Estrogen receptor positive status [ER+]: Secondary | ICD-10-CM | POA: Diagnosis not present

## 2023-05-15 DIAGNOSIS — D0512 Intraductal carcinoma in situ of left breast: Secondary | ICD-10-CM | POA: Diagnosis not present

## 2023-05-15 LAB — RAD ONC ARIA SESSION SUMMARY
Course Elapsed Days: 8
Plan Fractions Treated to Date: 7
Plan Prescribed Dose Per Fraction: 2.66 Gy
Plan Total Fractions Prescribed: 16
Plan Total Prescribed Dose: 42.56 Gy
Reference Point Dosage Given to Date: 18.62 Gy
Reference Point Session Dosage Given: 2.66 Gy
Session Number: 7

## 2023-05-16 ENCOUNTER — Ambulatory Visit
Admission: RE | Admit: 2023-05-16 | Discharge: 2023-05-16 | Disposition: A | Discharge status: Home / Self Care | Payer: BC Managed Care – PPO | Source: Ambulatory Visit | Attending: Radiation Oncology | Admitting: Radiation Oncology

## 2023-05-16 ENCOUNTER — Other Ambulatory Visit: Payer: Self-pay

## 2023-05-16 DIAGNOSIS — Z170 Estrogen receptor positive status [ER+]: Secondary | ICD-10-CM | POA: Diagnosis not present

## 2023-05-16 DIAGNOSIS — D0512 Intraductal carcinoma in situ of left breast: Secondary | ICD-10-CM | POA: Diagnosis not present

## 2023-05-16 DIAGNOSIS — Z510 Encounter for antineoplastic radiation therapy: Secondary | ICD-10-CM | POA: Diagnosis not present

## 2023-05-16 LAB — RAD ONC ARIA SESSION SUMMARY
Course Elapsed Days: 9
Plan Fractions Treated to Date: 8
Plan Prescribed Dose Per Fraction: 2.66 Gy
Plan Total Fractions Prescribed: 16
Plan Total Prescribed Dose: 42.56 Gy
Reference Point Dosage Given to Date: 21.28 Gy
Reference Point Session Dosage Given: 2.66 Gy
Session Number: 8

## 2023-05-19 ENCOUNTER — Other Ambulatory Visit: Payer: Self-pay

## 2023-05-19 ENCOUNTER — Ambulatory Visit
Admission: RE | Admit: 2023-05-19 | Discharge: 2023-05-19 | Disposition: A | Discharge status: Home / Self Care | Payer: BC Managed Care – PPO | Source: Ambulatory Visit | Attending: Radiation Oncology | Admitting: Radiation Oncology

## 2023-05-19 DIAGNOSIS — Z510 Encounter for antineoplastic radiation therapy: Secondary | ICD-10-CM | POA: Diagnosis not present

## 2023-05-19 DIAGNOSIS — Z170 Estrogen receptor positive status [ER+]: Secondary | ICD-10-CM | POA: Diagnosis not present

## 2023-05-19 DIAGNOSIS — D0512 Intraductal carcinoma in situ of left breast: Secondary | ICD-10-CM | POA: Diagnosis not present

## 2023-05-19 LAB — RAD ONC ARIA SESSION SUMMARY
Course Elapsed Days: 12
Plan Fractions Treated to Date: 9
Plan Prescribed Dose Per Fraction: 2.66 Gy
Plan Total Fractions Prescribed: 16
Plan Total Prescribed Dose: 42.56 Gy
Reference Point Dosage Given to Date: 23.94 Gy
Reference Point Session Dosage Given: 2.66 Gy
Session Number: 9

## 2023-05-20 ENCOUNTER — Ambulatory Visit
Admission: RE | Admit: 2023-05-20 | Discharge: 2023-05-20 | Disposition: A | Discharge status: Home / Self Care | Payer: BC Managed Care – PPO | Source: Ambulatory Visit | Attending: Radiation Oncology | Admitting: Radiation Oncology

## 2023-05-20 ENCOUNTER — Other Ambulatory Visit: Payer: Self-pay

## 2023-05-20 DIAGNOSIS — D0512 Intraductal carcinoma in situ of left breast: Secondary | ICD-10-CM | POA: Diagnosis not present

## 2023-05-20 DIAGNOSIS — Z510 Encounter for antineoplastic radiation therapy: Secondary | ICD-10-CM | POA: Diagnosis not present

## 2023-05-20 DIAGNOSIS — Z170 Estrogen receptor positive status [ER+]: Secondary | ICD-10-CM | POA: Diagnosis not present

## 2023-05-20 LAB — RAD ONC ARIA SESSION SUMMARY
Course Elapsed Days: 13
Plan Fractions Treated to Date: 10
Plan Prescribed Dose Per Fraction: 2.66 Gy
Plan Total Fractions Prescribed: 16
Plan Total Prescribed Dose: 42.56 Gy
Reference Point Dosage Given to Date: 26.6 Gy
Reference Point Session Dosage Given: 2.66 Gy
Session Number: 10

## 2023-05-21 ENCOUNTER — Ambulatory Visit
Admission: RE | Admit: 2023-05-21 | Discharge: 2023-05-21 | Disposition: A | Discharge status: Home / Self Care | Payer: BC Managed Care – PPO | Source: Ambulatory Visit | Attending: Radiation Oncology | Admitting: Radiation Oncology

## 2023-05-21 ENCOUNTER — Other Ambulatory Visit: Payer: Self-pay

## 2023-05-21 DIAGNOSIS — Z170 Estrogen receptor positive status [ER+]: Secondary | ICD-10-CM | POA: Diagnosis not present

## 2023-05-21 DIAGNOSIS — D0512 Intraductal carcinoma in situ of left breast: Secondary | ICD-10-CM | POA: Diagnosis not present

## 2023-05-21 DIAGNOSIS — Z510 Encounter for antineoplastic radiation therapy: Secondary | ICD-10-CM | POA: Diagnosis not present

## 2023-05-21 LAB — RAD ONC ARIA SESSION SUMMARY
Course Elapsed Days: 14
Plan Fractions Treated to Date: 11
Plan Prescribed Dose Per Fraction: 2.66 Gy
Plan Total Fractions Prescribed: 16
Plan Total Prescribed Dose: 42.56 Gy
Reference Point Dosage Given to Date: 29.26 Gy
Reference Point Session Dosage Given: 2.66 Gy
Session Number: 11

## 2023-05-22 ENCOUNTER — Ambulatory Visit
Admission: RE | Admit: 2023-05-22 | Discharge: 2023-05-22 | Disposition: A | Discharge status: Home / Self Care | Payer: BC Managed Care – PPO | Source: Ambulatory Visit | Attending: Radiation Oncology | Admitting: Radiation Oncology

## 2023-05-22 ENCOUNTER — Other Ambulatory Visit: Payer: Self-pay

## 2023-05-22 DIAGNOSIS — Z170 Estrogen receptor positive status [ER+]: Secondary | ICD-10-CM | POA: Diagnosis not present

## 2023-05-22 DIAGNOSIS — D0512 Intraductal carcinoma in situ of left breast: Secondary | ICD-10-CM | POA: Diagnosis not present

## 2023-05-22 DIAGNOSIS — Z510 Encounter for antineoplastic radiation therapy: Secondary | ICD-10-CM | POA: Diagnosis not present

## 2023-05-22 LAB — RAD ONC ARIA SESSION SUMMARY
Course Elapsed Days: 15
Plan Fractions Treated to Date: 12
Plan Prescribed Dose Per Fraction: 2.66 Gy
Plan Total Fractions Prescribed: 16
Plan Total Prescribed Dose: 42.56 Gy
Reference Point Dosage Given to Date: 31.92 Gy
Reference Point Session Dosage Given: 2.66 Gy
Session Number: 12

## 2023-05-23 ENCOUNTER — Ambulatory Visit
Admission: RE | Admit: 2023-05-23 | Discharge: 2023-05-23 | Disposition: A | Discharge status: Home / Self Care | Payer: BC Managed Care – PPO | Source: Ambulatory Visit | Attending: Radiation Oncology | Admitting: Radiation Oncology

## 2023-05-23 ENCOUNTER — Other Ambulatory Visit: Payer: Self-pay

## 2023-05-23 DIAGNOSIS — Z170 Estrogen receptor positive status [ER+]: Secondary | ICD-10-CM | POA: Diagnosis not present

## 2023-05-23 DIAGNOSIS — Z510 Encounter for antineoplastic radiation therapy: Secondary | ICD-10-CM | POA: Diagnosis not present

## 2023-05-23 DIAGNOSIS — D0512 Intraductal carcinoma in situ of left breast: Secondary | ICD-10-CM | POA: Diagnosis not present

## 2023-05-23 LAB — RAD ONC ARIA SESSION SUMMARY
Course Elapsed Days: 16
Plan Fractions Treated to Date: 13
Plan Prescribed Dose Per Fraction: 2.66 Gy
Plan Total Fractions Prescribed: 16
Plan Total Prescribed Dose: 42.56 Gy
Reference Point Dosage Given to Date: 34.58 Gy
Reference Point Session Dosage Given: 2.66 Gy
Session Number: 13

## 2023-05-26 ENCOUNTER — Inpatient Hospital Stay: Payer: BC Managed Care – PPO

## 2023-05-26 ENCOUNTER — Other Ambulatory Visit: Payer: Self-pay

## 2023-05-26 ENCOUNTER — Ambulatory Visit
Admission: RE | Admit: 2023-05-26 | Discharge: 2023-05-26 | Disposition: A | Discharge status: Home / Self Care | Payer: BC Managed Care – PPO | Source: Ambulatory Visit | Attending: Radiation Oncology | Admitting: Radiation Oncology

## 2023-05-26 DIAGNOSIS — Z170 Estrogen receptor positive status [ER+]: Secondary | ICD-10-CM | POA: Diagnosis not present

## 2023-05-26 DIAGNOSIS — D0512 Intraductal carcinoma in situ of left breast: Secondary | ICD-10-CM | POA: Diagnosis not present

## 2023-05-26 DIAGNOSIS — Z510 Encounter for antineoplastic radiation therapy: Secondary | ICD-10-CM | POA: Diagnosis not present

## 2023-05-26 LAB — RAD ONC ARIA SESSION SUMMARY
Course Elapsed Days: 19
Plan Fractions Treated to Date: 14
Plan Prescribed Dose Per Fraction: 2.66 Gy
Plan Total Fractions Prescribed: 16
Plan Total Prescribed Dose: 42.56 Gy
Reference Point Dosage Given to Date: 37.24 Gy
Reference Point Session Dosage Given: 2.66 Gy
Session Number: 14

## 2023-05-27 ENCOUNTER — Other Ambulatory Visit: Payer: Self-pay

## 2023-05-27 ENCOUNTER — Ambulatory Visit
Admission: RE | Admit: 2023-05-27 | Discharge: 2023-05-27 | Disposition: A | Discharge status: Home / Self Care | Payer: BC Managed Care – PPO | Source: Ambulatory Visit | Attending: Radiation Oncology | Admitting: Radiation Oncology

## 2023-05-27 DIAGNOSIS — Z170 Estrogen receptor positive status [ER+]: Secondary | ICD-10-CM | POA: Diagnosis not present

## 2023-05-27 DIAGNOSIS — F1110 Opioid abuse, uncomplicated: Secondary | ICD-10-CM | POA: Diagnosis not present

## 2023-05-27 DIAGNOSIS — Z510 Encounter for antineoplastic radiation therapy: Secondary | ICD-10-CM | POA: Diagnosis not present

## 2023-05-27 DIAGNOSIS — F411 Generalized anxiety disorder: Secondary | ICD-10-CM | POA: Diagnosis not present

## 2023-05-27 DIAGNOSIS — D0512 Intraductal carcinoma in situ of left breast: Secondary | ICD-10-CM | POA: Diagnosis not present

## 2023-05-27 DIAGNOSIS — F1120 Opioid dependence, uncomplicated: Secondary | ICD-10-CM | POA: Diagnosis not present

## 2023-05-27 LAB — RAD ONC ARIA SESSION SUMMARY
Course Elapsed Days: 20
Plan Fractions Treated to Date: 15
Plan Prescribed Dose Per Fraction: 2.66 Gy
Plan Total Fractions Prescribed: 16
Plan Total Prescribed Dose: 42.56 Gy
Reference Point Dosage Given to Date: 39.9 Gy
Reference Point Session Dosage Given: 2.66 Gy
Session Number: 15

## 2023-05-28 ENCOUNTER — Ambulatory Visit
Admission: RE | Admit: 2023-05-28 | Discharge: 2023-05-28 | Disposition: A | Discharge status: Home / Self Care | Payer: BC Managed Care – PPO | Source: Ambulatory Visit | Attending: Radiation Oncology | Admitting: Radiation Oncology

## 2023-05-28 ENCOUNTER — Other Ambulatory Visit: Payer: Self-pay

## 2023-05-28 ENCOUNTER — Ambulatory Visit: Admission: RE | Admit: 2023-05-28 | Payer: BC Managed Care – PPO | Source: Ambulatory Visit

## 2023-05-28 DIAGNOSIS — Z170 Estrogen receptor positive status [ER+]: Secondary | ICD-10-CM | POA: Diagnosis not present

## 2023-05-28 DIAGNOSIS — Z510 Encounter for antineoplastic radiation therapy: Secondary | ICD-10-CM | POA: Diagnosis not present

## 2023-05-28 DIAGNOSIS — D0512 Intraductal carcinoma in situ of left breast: Secondary | ICD-10-CM | POA: Diagnosis not present

## 2023-05-28 LAB — RAD ONC ARIA SESSION SUMMARY
Course Elapsed Days: 21
Plan Fractions Treated to Date: 16
Plan Prescribed Dose Per Fraction: 2.66 Gy
Plan Total Fractions Prescribed: 16
Plan Total Prescribed Dose: 42.56 Gy
Reference Point Dosage Given to Date: 42.56 Gy
Reference Point Session Dosage Given: 2.66 Gy
Session Number: 16

## 2023-05-29 ENCOUNTER — Other Ambulatory Visit: Payer: Self-pay

## 2023-05-29 ENCOUNTER — Ambulatory Visit
Admission: RE | Admit: 2023-05-29 | Discharge: 2023-05-29 | Disposition: A | Discharge status: Home / Self Care | Payer: BC Managed Care – PPO | Source: Ambulatory Visit | Attending: Radiation Oncology | Admitting: Radiation Oncology

## 2023-05-29 DIAGNOSIS — D0512 Intraductal carcinoma in situ of left breast: Secondary | ICD-10-CM | POA: Diagnosis not present

## 2023-05-29 DIAGNOSIS — Z510 Encounter for antineoplastic radiation therapy: Secondary | ICD-10-CM | POA: Diagnosis not present

## 2023-05-29 LAB — RAD ONC ARIA SESSION SUMMARY
Course Elapsed Days: 22
Plan Fractions Treated to Date: 1
Plan Prescribed Dose Per Fraction: 2 Gy
Plan Total Fractions Prescribed: 5
Plan Total Prescribed Dose: 10 Gy
Reference Point Dosage Given to Date: 2 Gy
Reference Point Session Dosage Given: 2 Gy
Session Number: 17

## 2023-05-30 ENCOUNTER — Other Ambulatory Visit: Payer: Self-pay

## 2023-05-30 ENCOUNTER — Ambulatory Visit
Admission: RE | Admit: 2023-05-30 | Discharge: 2023-05-30 | Disposition: A | Discharge status: Home / Self Care | Payer: BC Managed Care – PPO | Source: Ambulatory Visit | Attending: Radiation Oncology | Admitting: Radiation Oncology

## 2023-05-30 DIAGNOSIS — Z510 Encounter for antineoplastic radiation therapy: Secondary | ICD-10-CM | POA: Diagnosis not present

## 2023-05-30 DIAGNOSIS — D0512 Intraductal carcinoma in situ of left breast: Secondary | ICD-10-CM | POA: Diagnosis not present

## 2023-05-30 LAB — RAD ONC ARIA SESSION SUMMARY
Course Elapsed Days: 23
Plan Fractions Treated to Date: 2
Plan Prescribed Dose Per Fraction: 2 Gy
Plan Total Fractions Prescribed: 5
Plan Total Prescribed Dose: 10 Gy
Reference Point Dosage Given to Date: 4 Gy
Reference Point Session Dosage Given: 2 Gy
Session Number: 18

## 2023-06-02 ENCOUNTER — Ambulatory Visit
Admission: RE | Admit: 2023-06-02 | Discharge: 2023-06-02 | Disposition: A | Discharge status: Home / Self Care | Payer: BC Managed Care – PPO | Source: Ambulatory Visit | Attending: Radiation Oncology | Admitting: Radiation Oncology

## 2023-06-02 ENCOUNTER — Other Ambulatory Visit: Payer: Self-pay

## 2023-06-02 DIAGNOSIS — F411 Generalized anxiety disorder: Secondary | ICD-10-CM | POA: Diagnosis not present

## 2023-06-02 DIAGNOSIS — Z7151 Drug abuse counseling and surveillance of drug abuser: Secondary | ICD-10-CM | POA: Diagnosis not present

## 2023-06-02 DIAGNOSIS — Z510 Encounter for antineoplastic radiation therapy: Secondary | ICD-10-CM | POA: Diagnosis not present

## 2023-06-02 DIAGNOSIS — D0512 Intraductal carcinoma in situ of left breast: Secondary | ICD-10-CM | POA: Diagnosis not present

## 2023-06-02 LAB — RAD ONC ARIA SESSION SUMMARY
Course Elapsed Days: 26
Plan Fractions Treated to Date: 3
Plan Prescribed Dose Per Fraction: 2 Gy
Plan Total Fractions Prescribed: 5
Plan Total Prescribed Dose: 10 Gy
Reference Point Dosage Given to Date: 6 Gy
Reference Point Session Dosage Given: 2 Gy
Session Number: 19

## 2023-06-03 ENCOUNTER — Ambulatory Visit: Payer: BC Managed Care – PPO

## 2023-06-04 ENCOUNTER — Other Ambulatory Visit: Payer: Self-pay

## 2023-06-04 ENCOUNTER — Ambulatory Visit: Payer: BC Managed Care – PPO

## 2023-06-04 ENCOUNTER — Ambulatory Visit
Admission: RE | Admit: 2023-06-04 | Discharge: 2023-06-04 | Disposition: A | Discharge status: Home / Self Care | Payer: BC Managed Care – PPO | Source: Ambulatory Visit | Attending: Radiation Oncology | Admitting: Radiation Oncology

## 2023-06-04 DIAGNOSIS — Z170 Estrogen receptor positive status [ER+]: Secondary | ICD-10-CM | POA: Diagnosis not present

## 2023-06-04 DIAGNOSIS — Z510 Encounter for antineoplastic radiation therapy: Secondary | ICD-10-CM | POA: Diagnosis not present

## 2023-06-04 DIAGNOSIS — D0512 Intraductal carcinoma in situ of left breast: Secondary | ICD-10-CM | POA: Diagnosis not present

## 2023-06-04 LAB — RAD ONC ARIA SESSION SUMMARY
Course Elapsed Days: 28
Plan Fractions Treated to Date: 4
Plan Prescribed Dose Per Fraction: 2 Gy
Plan Total Fractions Prescribed: 5
Plan Total Prescribed Dose: 10 Gy
Reference Point Dosage Given to Date: 8 Gy
Reference Point Session Dosage Given: 2 Gy
Session Number: 20

## 2023-06-05 ENCOUNTER — Ambulatory Visit
Admission: RE | Admit: 2023-06-05 | Discharge: 2023-06-05 | Disposition: A | Discharge status: Home / Self Care | Payer: BC Managed Care – PPO | Source: Ambulatory Visit | Attending: Radiation Oncology | Admitting: Radiation Oncology

## 2023-06-05 ENCOUNTER — Other Ambulatory Visit: Payer: Self-pay

## 2023-06-05 ENCOUNTER — Encounter: Payer: Self-pay | Admitting: *Deleted

## 2023-06-05 DIAGNOSIS — Z510 Encounter for antineoplastic radiation therapy: Secondary | ICD-10-CM | POA: Diagnosis not present

## 2023-06-05 DIAGNOSIS — D0512 Intraductal carcinoma in situ of left breast: Secondary | ICD-10-CM | POA: Diagnosis not present

## 2023-06-05 LAB — RAD ONC ARIA SESSION SUMMARY
Course Elapsed Days: 29
Plan Fractions Treated to Date: 5
Plan Prescribed Dose Per Fraction: 2 Gy
Plan Total Fractions Prescribed: 5
Plan Total Prescribed Dose: 10 Gy
Reference Point Dosage Given to Date: 10 Gy
Reference Point Session Dosage Given: 2 Gy
Session Number: 21

## 2023-06-09 ENCOUNTER — Other Ambulatory Visit: Payer: Self-pay | Admitting: *Deleted

## 2023-06-09 ENCOUNTER — Encounter: Payer: Self-pay | Admitting: *Deleted

## 2023-06-09 ENCOUNTER — Inpatient Hospital Stay: Payer: BC Managed Care – PPO | Attending: Oncology | Admitting: Oncology

## 2023-06-09 ENCOUNTER — Encounter: Payer: Self-pay | Admitting: Oncology

## 2023-06-09 VITALS — BP 149/93 | HR 80 | Temp 96.4°F | Resp 16 | Ht 63.0 in | Wt 145.0 lb

## 2023-06-09 DIAGNOSIS — D0512 Intraductal carcinoma in situ of left breast: Secondary | ICD-10-CM

## 2023-06-09 DIAGNOSIS — F17210 Nicotine dependence, cigarettes, uncomplicated: Secondary | ICD-10-CM | POA: Insufficient documentation

## 2023-06-09 DIAGNOSIS — Z79810 Long term (current) use of selective estrogen receptor modulators (SERMs): Secondary | ICD-10-CM | POA: Diagnosis not present

## 2023-06-09 MED ORDER — STRATA XRT EX GEL: 1.0000 "application " | Freq: Two times a day (BID) | CUTANEOUS | 1 refills | Status: DC

## 2023-06-09 MED ORDER — TAMOXIFEN CITRATE 20 MG PO TABS: 20.0000 mg | ORAL_TABLET | Freq: Every day | ORAL | 3 refills | Status: AC

## 2023-06-09 NOTE — Progress Notes (Signed)
[facility]  Telephone:(336) [phone] Fax:(336) [phone]  ID: Ann Cox OB: 03-18-1976  MR#: [id number]  AOZ#:[id number]  Patient Care Team: Patient, No Pcp Per as PCP - General (General Practice) Nadara Mustard, MD as Referring Physician (Obstetrics and Gynecology) Lemar Livings, Merrily Pew, MD (General Surgery) Hulen Luster, RN as Oncology Nurse Navigator  CHIEF COMPLAINT: DCIS left breast.  INTERVAL HISTORY: Patient returns to clinic today after the conclusion of her XRT for further evaluation and initiation of tamoxifen.  She had significant erythema and tenderness secondary to XRT on her left breast, but otherwise tolerated her treatments well.  She has no neurologic complaints.  She denies any recent fevers or illnesses.  She has a good appetite and denies weight loss.  She has no chest pain, shortness of breath, cough, or hemoptysis.  She denies any nausea, vomiting, constipation, or diarrhea.  She has no urinary complaints.  Patient offers no further specific complaints today.  REVIEW OF SYSTEMS:   Review of Systems  Constitutional: Negative.  Negative for fever, malaise/fatigue and weight loss.  Respiratory: Negative.  Negative for cough, hemoptysis and shortness of breath.   Cardiovascular: Negative.  Negative for chest pain and leg swelling.  Gastrointestinal: Negative.  Negative for abdominal pain.  Genitourinary: Negative.  Negative for dysuria.  Musculoskeletal: Negative.  Negative for back pain.  Skin: Negative.  Negative for rash.  Neurological: Negative.  Negative for dizziness, focal weakness, weakness and headaches.  Psychiatric/Behavioral: Negative.  The patient is not nervous/anxious.     As per HPI. Otherwise, a complete review of systems is negative.  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   MyRisk testing; (increased risk of breast/pancreatic)   Ovarian cyst     PAST SURGICAL HISTORY: Past Surgical History:  Procedure Laterality Date   ARTHROSCOPY WITH ANTERIOR CRUCIATE LIGAMENT (ACL) REPAIR WITH ANTERIOR TIBILIAS GRAFT Left 03/10/2015   Procedure: LEFT KNEE ARTHROSCOPY MEDIAL MENISCECTOMY  ANTERIOR CRUCIATE LIGAMENT (ACL) TIBIAL ANTERIOR ALLOGRAFT;  Surgeon: Teryl Lucy, MD;  Location: [facility];  Service: Orthopedics;  Laterality: Left;   BREAST BIOPSY Left 03/20/2023   Stereo bx, X-clip, path pending   BREAST BIOPSY Left 03/20/2023   MM LT BREAST BX W LOC DEV 1ST LESION IMAGE BX SPEC STEREO GUIDE 03/20/2023 [facility]   BREAST LUMPECTOMY WITH RADIOFREQUENCY TAG IDENTIFICATION Left 04/10/2023   Procedure: BREAST LUMPECTOMY WITH RADIOFREQUENCY TAG IDENTIFICATION;  Surgeon: Henrene Dodge, MD;  Location: [facility];  Service: General;  Laterality: Left;   KNEE ARTHROSCOPY WITH MEDIAL MENISECTOMY Left 03/10/2015   Procedure: KNEE ARTHROSCOPY WITH MEDIAL MENISECTOMY;  Surgeon: Teryl Lucy, MD;  Location: [facility];  Service: Orthopedics;  Laterality: Left;   ROBOTIC ASSISTED TOTAL HYSTERECTOMY WITH BILATERAL SALPINGO OOPHERECTOMY Bilateral 02/10/2023   Procedure: XI ROBOTIC ASSISTED TOTAL LAPAROSCOPIC HYSTERECTOMY WITH BILATERAL SALPINGO OOPHORECTOMY;  Surgeon: Hildred Laser, MD;  Location: [facility];  Service: Gynecology;  Laterality: Bilateral;    FAMILY HISTORY: Family History  Adopted: Yes  Problem Relation Age of Onset  Arthritis Mother    Hypertension Mother    Anxiety disorder Mother    Arthritis Father    Hyperlipidemia Father    Hypertension Father    Anxiety disorder Maternal Aunt     Ovarian cancer Maternal Aunt        dx 47s   Alcohol abuse Maternal Uncle    Alcohol abuse Paternal Uncle    Rheum arthritis Paternal Uncle    Arthritis Maternal Grandmother    Heart disease Maternal Grandmother    Anxiety disorder Maternal Grandmother    Hyperlipidemia Maternal Grandfather    Lung cancer Maternal Grandfather    Breast cancer Other        paternal great aunt    ADVANCED DIRECTIVES (Y/N):  N  HEALTH MAINTENANCE: Social History   Tobacco Use   Smoking status: Every Day    Packs/day: 0.50    Years: 14.00    Additional pack years: 0.00    Total pack years: 7.00    Types: Cigarettes    Passive exposure: Past   Smokeless tobacco: Never  Vaping Use   Vaping Use: Never used  Substance Use Topics   Alcohol use: Yes    Alcohol/week: 3.0 standard drinks of alcohol    Types: 3 Glasses of wine per week    Comment: occasionally   Drug use: No     Colonoscopy:  PAP:  Bone density:  Lipid panel:  No Known Allergies  Current Outpatient Medications  Medication Sig Dispense Refill   acetaminophen (TYLENOL) 500 MG tablet Take 2 tablets (1,000 mg total) by mouth every 6 (six) hours as needed for mild pain.     docusate sodium (COLACE) 100 MG capsule Take 1 capsule (100 mg total) by mouth 2 (two) times daily as needed. 30 capsule 2   estradiol (CLIMARA - DOSED IN MG/24 HR) 0.05 mg/24hr patch 0.05 mg once a week.     estrogens, conjugated, (PREMARIN) 0.625 MG tablet Take 1 tablet (0.625 mg total) by mouth daily. Take daily for 21 days then do not take for 7 days. 90 tablet 3   ibuprofen (ADVIL) 800 MG tablet Take 1 tablet (800 mg total) by mouth every 8 (eight) hours as needed for moderate pain. 60 tablet 1   ondansetron (ZOFRAN) 4 MG tablet Take 1 tablet (4 mg total) by mouth every 8 (eight) hours as needed for nausea or vomiting. 20 tablet 1   oxyCODONE (OXY IR/ROXICODONE) 5 MG immediate release tablet Take 1 tablet (5 mg total) by mouth every 4 (four) hours as  needed for severe pain. 30 tablet 0   simethicone (GAS-X) 80 MG chewable tablet Chew 1 tablet (80 mg total) by mouth 4 (four) times daily as needed for flatulence. 30 tablet 2   ZUBSOLV 2.9-0.71 MG SUBL Take 1.5 tablets by mouth every morning.     No current facility-administered medications for this visit.    OBJECTIVE: Vitals:   06/09/23 1045  BP: (!) 149/93  Pulse: 80  Resp: 16  Temp: (!) 96.4 F (35.8 C)  SpO2: 98%     Body mass index is 25.69 kg/m.    ECOG FS:0 - Asymptomatic  General: Well-developed, well-nourished, no acute distress. Eyes: Pink conjunctiva, anicteric sclera. HEENT: Normocephalic, moist mucous membranes. Breast: Left breast with significant erythema without ulceration. Lungs: No audible wheezing or coughing. Heart: Regular rate and rhythm. Abdomen: Soft, nontender, no obvious distention. Musculoskeletal: No edema, cyanosis, or clubbing. Neuro: Alert, answering all questions appropriately. Cranial nerves grossly intact.  Skin: No rashes or petechiae noted. Psych: Normal affect.   LAB RESULTS:  Lab Results  Component Value Date   NA 138 02/10/2023   K 4.0 02/10/2023   CL 105 02/10/2023   CO2 24 11/18/2022   GLUCOSE 98 02/10/2023   BUN 9 02/10/2023   CREATININE 0.60 02/10/2023   CALCIUM 9.2 11/18/2022   PROT 7.6 11/18/2022   ALBUMIN 4.1 11/18/2022   AST 15 11/18/2022   ALT 9 11/18/2022   ALKPHOS 71 11/18/2022   BILITOT 0.5 11/18/2022   GFRNONAA >60 11/18/2022   GFRAA >60 11/04/2019    Lab Results  Component Value Date   WBC 5.7 05/12/2023   NEUTROABS 3.8 11/19/2022   HGB 15.1 (H) 05/12/2023   HCT 44.6 05/12/2023   MCV 87.8 05/12/2023   PLT 153 05/12/2023     STUDIES: No results found.  ASSESSMENT: DCIS left breast.  PLAN:    DCIS left breast: Patient underwent left breast lumpectomy on Apr 10, 2023 confirming diagnosis.  Given the noninvasive nature of her disease, she did not require chemotherapy.  She completed XRT in June  2024.  Patient was given a prescription for tamoxifen today and is recommended to take treatment for a minimum of 5 years completing in July 2029, although given her ATM gene mutation may consider extending treatment.  No further intervention is needed.  Return to clinic in 3 months for routine evaluation.  ATM gene mutation: Appreciate genetics input.  Patient has undergone total hysterectomy.  Continue mammograms as recommended.  I spent a total of 30 minutes reviewing chart data, face-to-face evaluation with the patient, counseling and coordination of care as detailed above.   Patient expressed understanding and was in agreement with this plan. She also understands that She can call clinic at any time with any questions, concerns, or complaints.    Cancer Staging  Ductal carcinoma in situ (DCIS) of left breast Staging form: Breast, AJCC 8th Edition - Clinical stage from 03/25/2023: Stage 0 (cTis (DCIS), cN0, cM0) - Signed by Jeralyn Ruths, MD on 03/25/2023 Stage prefix: Initial diagnosis Nuclear grade: G3   Jeralyn Ruths, MD   06/09/2023 11:26 AM

## 2023-06-24 DIAGNOSIS — F17210 Nicotine dependence, cigarettes, uncomplicated: Secondary | ICD-10-CM | POA: Diagnosis not present

## 2023-06-24 DIAGNOSIS — Z7151 Drug abuse counseling and surveillance of drug abuser: Secondary | ICD-10-CM | POA: Diagnosis not present

## 2023-06-24 DIAGNOSIS — F411 Generalized anxiety disorder: Secondary | ICD-10-CM | POA: Diagnosis not present

## 2023-07-03 ENCOUNTER — Telehealth: Payer: Self-pay | Admitting: *Deleted

## 2023-07-03 NOTE — Telephone Encounter (Signed)
Patient called stating that she needs a hard copy note to return to work. Please return her call when ready

## 2023-07-09 ENCOUNTER — Ambulatory Visit
Admission: RE | Admit: 2023-07-09 | Discharge: 2023-07-09 | Disposition: A | Discharge status: Home / Self Care | Payer: BC Managed Care – PPO | Source: Ambulatory Visit | Attending: Radiation Oncology | Admitting: Radiation Oncology

## 2023-07-09 VITALS — BP 143/88 | HR 99 | Temp 97.6°F | Resp 20 | Wt 145.0 lb

## 2023-07-09 DIAGNOSIS — Z170 Estrogen receptor positive status [ER+]: Secondary | ICD-10-CM | POA: Insufficient documentation

## 2023-07-09 DIAGNOSIS — C50912 Malignant neoplasm of unspecified site of left female breast: Secondary | ICD-10-CM | POA: Insufficient documentation

## 2023-07-10 NOTE — Progress Notes (Signed)
Radiation Oncology Follow up Note  Name: Ann Cox   Date:   07/09/2023 MRN:  [id number] DOB: 30-Oct-1976    This 47 y.o. female presents to the clinic today for 1 month follow-up status post whole breast radiation for ductal carcinoma in situ of the left breast.  ER positive  REFERRING PROVIDER: Jeralyn Ruths, MD  HPI: Patient is a 47 year old female now out 1 month having pleated whole breast radiation to her left breast for ductal carcinoma in situ.  ER positive.  Seen today in routine follow-up she is doing well.  She specifically denies breast tenderness cough or bone pain.  She has been started on tamoxifen tolerating that well without side effects.  She is having some hot flashes for which I recommended vitamin D supplements  COMPLICATIONS OF TREATMENT: none  FOLLOW UP COMPLIANCE: keeps appointments   PHYSICAL EXAM:  BP (!) 143/88   Pulse 99   Temp 97.6 F (36.4 C) (Tympanic)   Resp 20   Wt 145 lb (65.8 kg)   LMP 12/15/2021 (Approximate)   BMI 25.69 kg/m  Lungs are clear to A&P cardiac examination essentially unremarkable with regular rate and rhythm. No dominant mass or nodularity is noted in either breast in 2 positions examined. Incision is well-healed. No axillary or supraclavicular adenopathy is appreciated. Cosmetic result is excellent.  Well-developed well-nourished patient in NAD. HEENT reveals PERLA, EOMI, discs not visualized.  Oral cavity is clear. No oral mucosal lesions are identified. Neck is clear without evidence of cervical or supraclavicular adenopathy. Lungs are clear to A&P. Cardiac examination is essentially unremarkable with regular rate and rhythm without murmur rub or thrill. Abdomen is benign with no organomegaly or masses noted. Motor sensory and DTR levels are equal and symmetric in the upper and lower extremities. Cranial nerves II through XII are grossly intact. Proprioception is intact. No peripheral adenopathy or edema is identified. No motor  or sensory levels are noted. Crude visual fields are within normal range.  RADIOLOGY RESULTS: No current films for review  PLAN: Present time patient is doing well 1 month out from whole breast radiation and pleased with her overall progress she has excellent cosmetic result.  I have asked to see her back in 6 months for follow-up.  She continues on tamoxifen without side effect.  Patient is to call with any concerns.  I would like to take this opportunity to thank you for allowing me to participate in the care of your patient.Carmina Miller, MD

## 2023-08-19 DIAGNOSIS — F1110 Opioid abuse, uncomplicated: Secondary | ICD-10-CM | POA: Diagnosis not present

## 2023-08-19 DIAGNOSIS — F411 Generalized anxiety disorder: Secondary | ICD-10-CM | POA: Diagnosis not present

## 2023-08-29 DIAGNOSIS — F411 Generalized anxiety disorder: Secondary | ICD-10-CM | POA: Diagnosis not present

## 2023-08-29 DIAGNOSIS — Z7151 Drug abuse counseling and surveillance of drug abuser: Secondary | ICD-10-CM | POA: Diagnosis not present

## 2023-09-09 ENCOUNTER — Encounter: Payer: Self-pay | Admitting: Oncology

## 2023-09-09 ENCOUNTER — Inpatient Hospital Stay: Payer: BC Managed Care – PPO | Attending: Oncology | Admitting: Oncology

## 2023-09-09 VITALS — BP 146/90 | HR 84 | Temp 96.8°F | Resp 16 | Ht 63.0 in | Wt 149.0 lb

## 2023-09-09 DIAGNOSIS — D0512 Intraductal carcinoma in situ of left breast: Secondary | ICD-10-CM | POA: Diagnosis not present

## 2023-09-09 DIAGNOSIS — Z1509 Genetic susceptibility to other malignant neoplasm: Secondary | ICD-10-CM | POA: Diagnosis not present

## 2023-09-09 DIAGNOSIS — Z79810 Long term (current) use of selective estrogen receptor modulators (SERMs): Secondary | ICD-10-CM | POA: Diagnosis not present

## 2023-09-09 DIAGNOSIS — F17210 Nicotine dependence, cigarettes, uncomplicated: Secondary | ICD-10-CM | POA: Diagnosis not present

## 2023-09-09 DIAGNOSIS — R232 Flushing: Secondary | ICD-10-CM | POA: Diagnosis not present

## 2023-09-09 NOTE — Progress Notes (Signed)
[facility]  Telephone:(336) [phone] Fax:(336) [phone]  ID: Ralphine Cammer OB: September 12, 1976  MR#: [id number]  AOZ#:[id number]  Patient Care Team: Patient, No Pcp Per as PCP - General (General Practice) Nadara Mustard, MD as Referring Physician (Obstetrics and Gynecology) Hulen Luster, RN as Oncology Nurse Navigator Carmina Miller, MD as Consulting Physician (Radiation Oncology)  CHIEF COMPLAINT: DCIS left breast.  INTERVAL HISTORY: Patient returns to clinic today for routine 88-month evaluation.  She has noticed an increase in hot flashes since initiating tamoxifen, but otherwise feels well.  She continues to have occasional tenderness at her surgical site.  She has no neurologic complaints.  She denies any recent fevers or illnesses.  She has a good appetite and denies weight loss.  She has no chest pain, shortness of breath, cough, or hemoptysis.  She denies any nausea, vomiting, constipation, or diarrhea.  She has no urinary complaints.  Patient offers no further specific complaints today.  REVIEW OF SYSTEMS:   Review of Systems  Constitutional: Negative.  Negative for fever, malaise/fatigue and weight loss.  Respiratory: Negative.  Negative for cough, hemoptysis and shortness of breath.   Cardiovascular: Negative.  Negative for chest pain and leg swelling.  Gastrointestinal: Negative.  Negative for abdominal pain.  Genitourinary: Negative.  Negative for dysuria.  Musculoskeletal: Negative.  Negative for back pain.  Skin: Negative.  Negative for rash.  Neurological:  Positive for sensory change. Negative for dizziness, focal weakness, weakness and headaches.  Psychiatric/Behavioral: Negative.  The patient is not nervous/anxious.     As per HPI. Otherwise, a complete review of systems is negative.  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   MyRisk testing; (increased risk of breast/pancreatic)   Ovarian cyst     PAST SURGICAL HISTORY: Past Surgical History:  Procedure Laterality Date   ARTHROSCOPY WITH ANTERIOR CRUCIATE LIGAMENT (ACL) REPAIR WITH ANTERIOR TIBILIAS GRAFT Left 03/10/2015   Procedure: LEFT KNEE ARTHROSCOPY MEDIAL MENISCECTOMY  ANTERIOR CRUCIATE LIGAMENT (ACL) TIBIAL ANTERIOR ALLOGRAFT;  Surgeon: Teryl Lucy, MD;  Location: [facility];  Service: Orthopedics;  Laterality: Left;   BREAST BIOPSY Left 03/20/2023   Stereo bx, X-clip, path pending   BREAST BIOPSY Left 03/20/2023   MM LT BREAST BX W LOC DEV 1ST LESION IMAGE BX SPEC STEREO GUIDE 03/20/2023 [facility]   BREAST LUMPECTOMY WITH RADIOFREQUENCY TAG IDENTIFICATION Left 04/10/2023   Procedure: BREAST LUMPECTOMY WITH RADIOFREQUENCY TAG IDENTIFICATION;  Surgeon: Henrene Dodge, MD;  Location: [facility];  Service: General;  Laterality: Left;   KNEE ARTHROSCOPY WITH MEDIAL MENISECTOMY Left 03/10/2015   Procedure: KNEE ARTHROSCOPY WITH MEDIAL MENISECTOMY;  Surgeon: Teryl Lucy, MD;  Location: [facility];  Service: Orthopedics;  Laterality: Left;   ROBOTIC ASSISTED TOTAL HYSTERECTOMY WITH BILATERAL SALPINGO OOPHERECTOMY Bilateral 02/10/2023   Procedure: XI ROBOTIC ASSISTED TOTAL LAPAROSCOPIC HYSTERECTOMY WITH BILATERAL SALPINGO OOPHORECTOMY;  Surgeon: Hildred Laser, MD;  Location: [facility];  Service: Gynecology;  Laterality: Bilateral;    FAMILY HISTORY: Family History  Adopted: Yes  Problem Relation Age of  Onset   Arthritis Mother    Hypertension Mother    Anxiety disorder Mother    Arthritis Father    Hyperlipidemia Father    Hypertension Father     Anxiety disorder Maternal Aunt    Ovarian cancer Maternal Aunt        dx 49s   Alcohol abuse Maternal Uncle    Alcohol abuse Paternal Uncle    Rheum arthritis Paternal Uncle    Arthritis Maternal Grandmother    Heart disease Maternal Grandmother    Anxiety disorder Maternal Grandmother    Hyperlipidemia Maternal Grandfather    Lung cancer Maternal Grandfather    Breast cancer Other        paternal great aunt    ADVANCED DIRECTIVES (Y/N):  N  HEALTH MAINTENANCE: Social History   Tobacco Use   Smoking status: Every Day    Current packs/day: 0.50    Average packs/day: 0.5 packs/day for 14.0 years (7.0 ttl pk-yrs)    Types: Cigarettes    Passive exposure: Past   Smokeless tobacco: Never  Vaping Use   Vaping status: Never Used  Substance Use Topics   Alcohol use: Yes    Alcohol/week: 3.0 standard drinks of alcohol    Types: 3 Glasses of wine per week    Comment: occasionally   Drug use: No     Colonoscopy:  PAP:  Bone density:  Lipid panel:  No Known Allergies  Current Outpatient Medications  Medication Sig Dispense Refill   acetaminophen (TYLENOL) 500 MG tablet Take 2 tablets (1,000 mg total) by mouth every 6 (six) hours as needed for mild pain.     Dermatological Products, Misc. (STRATA XRT) GEL Apply 1 application  topically 2 (two) times daily. 50 g 1   docusate sodium (COLACE) 100 MG capsule Take 1 capsule (100 mg total) by mouth 2 (two) times daily as needed. 30 capsule 2   estradiol (CLIMARA - DOSED IN MG/24 HR) 0.05 mg/24hr patch 0.05 mg once a week.     estrogens, conjugated, (PREMARIN) 0.625 MG tablet Take 1 tablet (0.625 mg total) by mouth daily. Take daily for 21 days then do not take for 7 days. 90 tablet 3   ibuprofen (ADVIL) 800 MG tablet Take 1 tablet (800 mg total) by mouth every 8 (eight) hours as needed for moderate pain. 60 tablet 1   ondansetron (ZOFRAN) 4 MG tablet Take 1 tablet (4 mg total) by mouth every 8 (eight) hours as needed for nausea or  vomiting. 20 tablet 1   oxyCODONE (OXY IR/ROXICODONE) 5 MG immediate release tablet Take 1 tablet (5 mg total) by mouth every 4 (four) hours as needed for severe pain. 30 tablet 0   simethicone (GAS-X) 80 MG chewable tablet Chew 1 tablet (80 mg total) by mouth 4 (four) times daily as needed for flatulence. 30 tablet 2   tamoxifen (NOLVADEX) 20 MG tablet Take 1 tablet (20 mg total) by mouth daily. 90 tablet 3   traZODone (DESYREL) 50 MG tablet Take 50-100 mg by mouth at bedtime.     ZUBSOLV 2.9-0.71 MG SUBL Take 1.5 tablets by mouth every morning.     No current facility-administered medications for this visit.    OBJECTIVE: Vitals:   09/09/23 1110  BP: (!) 146/90  Pulse: 84  Resp: 16  Temp: (!) 96.8 F (36 C)  SpO2: 99%     Body mass index is 26.39 kg/m.    ECOG FS:0 - Asymptomatic  General: Well-developed, well-nourished, no  acute distress. Eyes: Pink conjunctiva, anicteric sclera. HEENT: Normocephalic, moist mucous membranes. Breast: Examined for today. Lungs: No audible wheezing or coughing. Heart: Regular rate and rhythm. Abdomen: Soft, nontender, no obvious distention. Musculoskeletal: No edema, cyanosis, or clubbing. Neuro: Alert, answering all questions appropriately. Cranial nerves grossly intact. Skin: No rashes or petechiae noted. Psych: Normal affect.  LAB RESULTS:  Lab Results  Component Value Date   NA 138 02/10/2023   K 4.0 02/10/2023   CL 105 02/10/2023   CO2 24 11/18/2022   GLUCOSE 98 02/10/2023   BUN 9 02/10/2023   CREATININE 0.60 02/10/2023   CALCIUM 9.2 11/18/2022   PROT 7.6 11/18/2022   ALBUMIN 4.1 11/18/2022   AST 15 11/18/2022   ALT 9 11/18/2022   ALKPHOS 71 11/18/2022   BILITOT 0.5 11/18/2022   GFRNONAA >60 11/18/2022   GFRAA >60 11/04/2019    Lab Results  Component Value Date   WBC 5.7 05/12/2023   NEUTROABS 3.8 11/19/2022   HGB 15.1 (H) 05/12/2023   HCT 44.6 05/12/2023   MCV 87.8 05/12/2023   PLT 153 05/12/2023      STUDIES: No results found.  ASSESSMENT: DCIS left breast.  PLAN:    DCIS left breast: Patient underwent left breast lumpectomy on Apr 10, 2023 confirming diagnosis.  Given the noninvasive nature of her disease, she did not require chemotherapy.  She completed XRT in June 2024.  Although patient is having hot flashes likely from tamoxifen, will continue treatment for now.  Recommendation is to take treatment for a minimum of 5 years completing in July 2029, although given her ATM gene mutation may consider extending treatment.  No further intervention is needed.  Return to clinic in 3 months for routine evaluation.   Hot flashes: Continue tamoxifen as above.  If no improvement at next clinic visit, can consider switching to letrozole. ATM gene mutation: Appreciate genetics input.  Patient has undergone total hysterectomy.  Continue mammograms as recommended.  I spent a total of 30 minutes reviewing chart data, face-to-face evaluation with the patient, counseling and coordination of care as detailed above.    Patient expressed understanding and was in agreement with this plan. She also understands that She can call clinic at any time with any questions, concerns, or complaints.    Cancer Staging  Ductal carcinoma in situ (DCIS) of left breast Staging form: Breast, AJCC 8th Edition - Clinical stage from 03/25/2023: Stage 0 (cTis (DCIS), cN0, cM0) - Signed by Jeralyn Ruths, MD on 03/25/2023 Stage prefix: Initial diagnosis Nuclear grade: G3   Jeralyn Ruths, MD   09/09/2023 2:23 PM

## 2023-09-16 DIAGNOSIS — F1110 Opioid abuse, uncomplicated: Secondary | ICD-10-CM | POA: Diagnosis not present

## 2023-09-16 DIAGNOSIS — F411 Generalized anxiety disorder: Secondary | ICD-10-CM | POA: Diagnosis not present

## 2023-09-16 DIAGNOSIS — F1120 Opioid dependence, uncomplicated: Secondary | ICD-10-CM | POA: Diagnosis not present

## 2023-09-19 DIAGNOSIS — Z7151 Drug abuse counseling and surveillance of drug abuser: Secondary | ICD-10-CM | POA: Diagnosis not present

## 2023-09-19 DIAGNOSIS — F411 Generalized anxiety disorder: Secondary | ICD-10-CM | POA: Diagnosis not present

## 2023-09-19 DIAGNOSIS — F17210 Nicotine dependence, cigarettes, uncomplicated: Secondary | ICD-10-CM | POA: Diagnosis not present

## 2023-09-22 ENCOUNTER — Encounter: Payer: Self-pay | Admitting: Oncology

## 2023-09-25 ENCOUNTER — Encounter: Payer: Self-pay | Admitting: Oncology

## 2023-09-29 ENCOUNTER — Ambulatory Visit: Payer: Self-pay | Admitting: Surgery

## 2023-12-15 ENCOUNTER — Encounter: Payer: Self-pay | Admitting: Oncology

## 2023-12-15 ENCOUNTER — Inpatient Hospital Stay: Payer: Self-pay | Attending: Oncology | Admitting: Oncology

## 2024-01-06 ENCOUNTER — Other Ambulatory Visit: Payer: Self-pay | Admitting: Obstetrics and Gynecology

## 2024-01-12 ENCOUNTER — Other Ambulatory Visit: Payer: Self-pay

## 2024-01-12 DIAGNOSIS — D0512 Intraductal carcinoma in situ of left breast: Secondary | ICD-10-CM

## 2024-01-15 ENCOUNTER — Encounter: Payer: Self-pay | Admitting: Radiation Oncology

## 2024-01-15 ENCOUNTER — Ambulatory Visit
Admission: RE | Admit: 2024-01-15 | Discharge: 2024-01-15 | Disposition: A | Discharge status: Home / Self Care | Payer: PRIVATE HEALTH INSURANCE | Source: Ambulatory Visit | Attending: Radiation Oncology | Admitting: Radiation Oncology

## 2024-01-15 ENCOUNTER — Encounter: Payer: Self-pay | Admitting: Oncology

## 2024-01-15 VITALS — BP 137/95 | HR 96 | Resp 14 | Ht 63.0 in

## 2024-01-15 DIAGNOSIS — Z79810 Long term (current) use of selective estrogen receptor modulators (SERMs): Secondary | ICD-10-CM | POA: Diagnosis not present

## 2024-01-15 DIAGNOSIS — Z923 Personal history of irradiation: Secondary | ICD-10-CM | POA: Insufficient documentation

## 2024-01-15 DIAGNOSIS — C50912 Malignant neoplasm of unspecified site of left female breast: Secondary | ICD-10-CM

## 2024-01-15 DIAGNOSIS — R232 Flushing: Secondary | ICD-10-CM | POA: Insufficient documentation

## 2024-01-15 DIAGNOSIS — Z170 Estrogen receptor positive status [ER+]: Secondary | ICD-10-CM | POA: Diagnosis not present

## 2024-01-15 DIAGNOSIS — D0512 Intraductal carcinoma in situ of left breast: Secondary | ICD-10-CM | POA: Diagnosis present

## 2024-01-15 NOTE — Progress Notes (Signed)
 Radiation Oncology Follow up Note  Name: Ann Cox   Date:   01/15/2024 MRN:  [id number] DOB: 07/13/1976    This 48 y.o. female presents to the clinic today for 46-month follow-up status post whole breast radiation to her left breast for ER positive ductal carcinoma in situ.  REFERRING PROVIDER: No ref. provider found  HPI: Patient is a 48 year old female now out 7 months having completed whole breast radiation to her left breast for ER positive ductal carcinoma in situ.  Seen today in routine follow-up she is doing well she still few has occasional pain in her left axilla as well as her breast continues to be somewhat tender I have assured her this is all from scar tissue.  She is also having some hot flashes is currently on.  Tamoxifen .  I have recommended vitamin D supplements for hot flashes.  She also has a mammogram scheduled in May.  COMPLICATIONS OF TREATMENT: none  FOLLOW UP COMPLIANCE: keeps appointments   PHYSICAL EXAM:  BP (!) 137/95   Pulse 96   Resp 14   Ht 5' 3 (1.6 m)   LMP 12/15/2021 (Approximate)   BMI 26.39 kg/m  Lungs are clear to A&P cardiac examination essentially unremarkable with regular rate and rhythm. No dominant mass or nodularity is noted in either breast in 2 positions examined. Incision is well-healed. No axillary or supraclavicular adenopathy is appreciated. Cosmetic result is excellent.  Well-developed well-nourished patient in NAD. HEENT reveals PERLA, EOMI, discs not visualized.  Oral cavity is clear. No oral mucosal lesions are identified. Neck is clear without evidence of cervical or supraclavicular adenopathy. Lungs are clear to A&P. Cardiac examination is essentially unremarkable with regular rate and rhythm without murmur rub or thrill. Abdomen is benign with no organomegaly or masses noted. Motor sensory and DTR levels are equal and symmetric in the upper and lower extremities. Cranial nerves II through XII are grossly intact. Proprioception is  intact. No peripheral adenopathy or edema is identified. No motor or sensory levels are noted. Crude visual fields are within normal range.  RADIOLOGY RESULTS: No current films for review  PLAN: Present time patient is doing well.  She has an excellent cosmetic result.  I am pleased with her overall progress.  I have asked to see her back in 6 months for follow-up and then we will start once year follow-up visits.  Patient comprehends my recommendations well.  I would like to take this opportunity to thank you for allowing me to participate in the care of your patient.SABRA Marcey Penton, MD

## 2024-01-28 ENCOUNTER — Encounter: Payer: Self-pay | Admitting: Genetic Counselor

## 2024-03-08 ENCOUNTER — Ambulatory Visit
Admission: RE | Admit: 2024-03-08 | Discharge: 2024-03-08 | Disposition: A | Discharge status: Home / Self Care | Payer: PRIVATE HEALTH INSURANCE | Source: Ambulatory Visit | Attending: Surgery | Admitting: Surgery

## 2024-03-08 DIAGNOSIS — D0512 Intraductal carcinoma in situ of left breast: Secondary | ICD-10-CM | POA: Insufficient documentation

## 2024-03-15 ENCOUNTER — Ambulatory Visit (INDEPENDENT_AMBULATORY_CARE_PROVIDER_SITE_OTHER): Payer: PRIVATE HEALTH INSURANCE | Admitting: Surgery

## 2024-03-15 ENCOUNTER — Encounter: Payer: Self-pay | Admitting: Surgery

## 2024-03-15 VITALS — BP 135/87 | HR 82 | Temp 98.2°F | Ht 63.0 in | Wt 164.2 lb

## 2024-03-15 DIAGNOSIS — D0512 Intraductal carcinoma in situ of left breast: Secondary | ICD-10-CM | POA: Diagnosis not present

## 2024-03-15 DIAGNOSIS — Z09 Encounter for follow-up examination after completed treatment for conditions other than malignant neoplasm: Secondary | ICD-10-CM | POA: Diagnosis not present

## 2024-03-15 NOTE — Patient Instructions (Signed)
 Please call Dr. Milinda Cave office to schedule an appointment.   Follow up in 6 months.   Exercises Following Breast Surgery Ask your health care provider which exercises are safe for you. Do exercises exactly as told by your health care provider and adjust them as directed. It is normal to feel mild stretching, pulling, tightness, or discomfort as you do these exercises. Stop right away if you feel sudden pain or your pain gets worse. Do not begin these exercises until told by your health care provider. Exercises Deep breathing  Lie down on your back. You may bend your knees for comfort. Slowly breathe in as much air as you can and expand your chest and abdomen. Think about pushing your belly button away from your spine while you do this. It may help to put your hands on your belly so you can feel it expand. Slowly breathe out. Repeat these steps 4-5 times or the number of times that is comfortable for you. Wand exercise  Lie down on your back. You may bend your knees for comfort. Position your hands so your thumbs are pointing toward each other. With both hands, hold a wand-shaped object, such as a broom handle or a cane. Your hands should be about shoulder-width apart on the object. Start with the object resting across your hips. Slowly lift the wand toward the ceiling. Use your unaffected arm to help lift the wand. If it is comfortable for you, lengthen the movement to go from your hips to over your head. Repeat these steps 5-7 times. Elbow winging  Lie down on your back. You may bend your knees for comfort. Clasp your hands together behind your head so that your elbows point toward the ceiling. Keep your hands together and move your elbows apart and down toward the floor as far as you comfortably can. Repeat these steps 5-7 times. Swelling reduction, lying down  Lie down on your back. You may bend your knees for comfort. Raise (elevate) your affected arm above the level of your  heart and keep it elevated during the exercise. Open and close your hand on the affected side 15-25 times in a row. Bend and straighten your elbow 15-25 times in a row. You may keep your arm elevated for up to 45 minutes at a time. This helps to reduce swelling. You may rest your arm on top of pillows to make this easier. Do this exercise 2-4 times a day. Swelling reduction, sitting  Sit in a chair. You may rest your arm on a pillow. Hold a tennis ball, a rolled-up towel, or a similar object in your hand on your affected side. Slowly squeeze the ball or towel. Try to do this using only your hand muscles. Relax your hand. Repeat these steps 15-25 times. Shoulder blade stretch  Sit in a chair, facing a table. Your back should be against the back of the chair. Place your unaffected arm on the table with your palm down and your elbow bent. This arm will support you and will not move during the exercise. Place your affected arm on the table with your palm down and your elbow straight. Slide your affected arm forward, toward the opposite side of the table, but avoid leaning forward. While you do this, you should feel the shoulder blade on your affected side move away from the back of the chair. You may put a towel under your hand to help it slide more easily. Repeat these steps 5-7 times. Shoulder blade squeeze  Sit in a stable chair with good posture. Avoid letting your back touch the back of the chair. Your arms should be at your sides with your elbows bent. You may rest your forearms on a pillow. Squeeze your shoulder blades together. Think about trying to bring them down and back. Keep your shoulders level. Do not lift your shoulders up toward your ears. Relax your muscles completely before you repeat this exercise. Repeat these steps 5-7 times. Side bend  Sit in a stable chair with your feet flat on the floor. You may put your feet shoulder-width apart to help you feel more  stable. Clasp your hands together in your lap and lift your hands slowly over your head until your arms are straight. You may use your unaffected arm to help lift your other arm. With your arms above your head, bend at your waist to your right so you feel a stretch in your left side. Straighten your abdomen and move your arms back to the center, above your head. Repeat step 3 on the other side of your body by bending to the left until you feel a stretch in your right side. Repeat these steps 5-7 times. Chest stretch  Stand in a doorway with one of your feet in front of the other. It does not matter which foot is forward. If you cannot reach your forearms to the door frame, do this exercise in a corner of a room. Bend your elbows and place your forearms on the door frame or on each side of the corner. Your elbows should be as close to shoulder height as possible. Slowly move your weight onto your front foot until you feel a stretch across your chest and in the front of your shoulders. Do not let your shoulders move up toward your ears. Repeat these steps 5-7 times. Wall climb, flexion  Stand facing a wall with your toes about 8-10 inches (20-25 cm) from the wall. Place your hands on the wall, about shoulder-width apart. Move your hands up the wall and stretch toward the ceiling (flexion). Do not let your shoulders shrug. Do not arch your back. Repeat these steps 5-7 times. Wall climb, abduction  Stand about 18 inches (45.7 cm) from a wall, with your affected side facing the wall. Bend the elbow of your arm on your affected side, and place your hand on the wall. Move your hand up the wall and toward the ceiling (abduction). Do not let your shoulders shrug. Repeat these steps 5-7 times. Contact a health care provider if you: Have trouble doing any of the exercises. Have pain or swelling that gets worse. Develop: New pain or swelling. A feeling of heaviness in your arm. Numbness or  tingling in your arms or chest. This information is not intended to replace advice given to you by your health care provider. Make sure you discuss any questions you have with your health care provider. Document Revised: 11/13/2021 Document Reviewed: 11/13/2021 Elsevier Patient Education  2024 [employer].

## 2024-03-15 NOTE — Progress Notes (Signed)
 03/15/2024  History of Present Illness: Ann Cox is a 48 y.o. 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  She completed radiation and is currently on Tamoxifen.  The patient reports today that she's doing ok, except for some intermittent sharp pain in the left breast.  This is sometimes associated with her positioning and movements.  Otherwise denies any palpable masses or nipple changes.  She does report very dry skin and has been using [employer].  She had a mammogram on 03/08/24 which only showed left sided post-operative changes, without any suspicious findings.  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   MyRisk testing; (increased risk of breast/pancreatic)   Ovarian cyst      Past Surgical History: Past Surgical History:  Procedure Laterality Date   ARTHROSCOPY WITH ANTERIOR CRUCIATE LIGAMENT (ACL) REPAIR WITH ANTERIOR TIBILIAS GRAFT Left 03/10/2015   Procedure: LEFT KNEE ARTHROSCOPY MEDIAL MENISCECTOMY  ANTERIOR CRUCIATE LIGAMENT (ACL) TIBIAL ANTERIOR ALLOGRAFT;  Surgeon: Teryl Lucy, MD;  Location: [facility];  Service: Orthopedics;  Laterality: Left;   BREAST BIOPSY Left 03/20/2023   Stereo bx, X-clip,  HIGH-GRADE DUCTAL CARCINOMA IN SITU WITH COMEDONECROSIS AND CALCIFICATIONS. Comment: Ductal carcinoma in situ is present in 5 of 7 sections and spans an area of approximately 8 mm.   BREAST BIOPSY Left 03/20/2023   MM LT BREAST BX W LOC DEV 1ST LESION IMAGE BX SPEC STEREO GUIDE 03/20/2023 [facility]   BREAST LUMPECTOMY WITH  RADIOFREQUENCY TAG IDENTIFICATION Left 04/10/2023   Procedure: BREAST LUMPECTOMY WITH RADIOFREQUENCY TAG IDENTIFICATION;  Surgeon: Henrene Dodge, MD;  Location: [facility];  Service: General;  Laterality: Left;   KNEE ARTHROSCOPY WITH MEDIAL MENISECTOMY Left 03/10/2015   Procedure: KNEE ARTHROSCOPY WITH MEDIAL MENISECTOMY;  Surgeon: Teryl Lucy, MD;  Location: [facility];  Service: Orthopedics;  Laterality: Left;   ROBOTIC ASSISTED TOTAL HYSTERECTOMY WITH BILATERAL SALPINGO OOPHERECTOMY Bilateral 02/10/2023   Procedure: XI ROBOTIC ASSISTED TOTAL LAPAROSCOPIC HYSTERECTOMY WITH BILATERAL SALPINGO OOPHORECTOMY;  Surgeon: Hildred Laser, MD;  Location: [facility];  Service: Gynecology;  Laterality: Bilateral;    Home Medications: Prior to Admission medications   Medication Sig Start Date End Date Taking? Authorizing Provider  docusate sodium (COLACE) 100 MG capsule Take 1 capsule (100 mg total) by mouth 2 (two) times daily as needed. 02/10/23  Yes Hildred Laser, MD  estradiol (CLIMARA - DOSED IN MG/24 HR) 0.05 mg/24hr patch 0.05 mg once a week. 05/03/23  Yes [provider]  estrogens, conjugated, (PREMARIN) 0.625 MG tablet Take 1 tablet (0.625 mg total) by mouth daily. Take daily for 21 days then do not take for 7 days. 03/25/23  Yes Hildred Laser, MD  tamoxifen (NOLVADEX) 20 MG tablet Take 1 tablet (20 mg total) by mouth daily. 06/09/23  Yes Jeralyn Ruths, MD  traZODone (DESYREL) 50 MG tablet Take 50-100 mg by mouth at bedtime. 09/01/23  Yes [provider]  ZUBSOLV 2.9-0.71 MG SUBL Take 1.5 tablets by mouth every morning. 07/27/19  Yes [provider]    Allergies: No Known Allergies  Review of Systems: Review of Systems  Constitutional:  Negative for chills and fever.  Respiratory:  Negative for shortness of breath.   Cardiovascular:  Negative for chest pain.  Gastrointestinal:  Negative for nausea and vomiting.  Skin:        Dry skin     Physical  Exam BP 135/87   Pulse 82   Temp 98.2 F (36.8 C) (Oral)   Ht 5\' 3"  (1.6 m)   Wt 164 lb 3.2 oz (74.5 kg)   LMP 12/15/2021 (Approximate)   SpO2 95%   BMI 29.09 kg/m  CONSTITUTIONAL: No acute distress, well nourished. HEENT:  Normocephalic, atraumatic, extraocular motion intact. RESPIRATORY:  Normal respiratory effort without pathologic use of accessory muscles. CARDIOVASCULAR: Regular rhythm and rate. BREAST:  Left breast s/p lumpectomy in upper outer quadrant, with incision well healed.  No palpable masses or nipple changes.  There is is scaly/dry skin over breast extending to left axilla.  No left axillary lymphadenopathy.  Right breast without any palpable masses, skin changes, or nipple changes.  No right axillary lymphadenopathy. NEUROLOGIC:  Motor and sensation is grossly normal.  Cranial nerves are grossly intact. PSYCH:  Alert and oriented to person, place and time. Affect is normal.  Labs/Imaging: Mammogram on 03/08/24: FINDINGS: Post lumpectomy changes are noted in the upper outer left breast.   There are no breast masses, areas of significant asymmetry, areas of nonsurgical architectural distortion or suspicious calcifications.   IMPRESSION: 1. No evidence of new or recurrent breast carcinoma. 2. Benign post lumpectomy changes on the left.   RECOMMENDATION: Diagnostic mammography in 1 year per standard post lumpectomy protocol.   I have discussed the findings and recommendations with the patient. If applicable, a reminder letter will be sent to the patient regarding the next appointment.   BI-RADS CATEGORY  2: Benign.  Assessment and Plan: This is a 48 y.o. female s/p left breast lumpectomy  --Discussed with the patient the findings on her mammogram showing no suspicious findings and only post-op changes on the left breast.  She does have some skin dryness/scaliness likely from radiation burn.  Recommended that she use vitamin E lotion or moisturizing lotion to help  with the dryness as the skin continues to heal. --She was supposed to see Dr. Orlie Dakin in January for follow up but did not show.  Advised to contact his office to set up a follow up appointment. --Otherwise, follow up with me in 6 months.  I spent 20 minutes dedicated to the care of this patient on the date of this encounter to include pre-visit review of records, face-to-face time with the patient discussing diagnosis and management, and any post-visit coordination of care.   Howie Ill, MD [facility]

## 2024-07-19 ENCOUNTER — Other Ambulatory Visit: Payer: Self-pay

## 2024-07-20 ENCOUNTER — Other Ambulatory Visit: Payer: Self-pay | Admitting: *Deleted

## 2024-07-26 ENCOUNTER — Telehealth: Payer: Self-pay | Admitting: *Deleted

## 2024-07-26 ENCOUNTER — Ambulatory Visit
Admission: RE | Admit: 2024-07-26 | Discharge: 2024-07-26 | Disposition: A | Discharge status: Home / Self Care | Payer: PRIVATE HEALTH INSURANCE | Source: Ambulatory Visit | Attending: Radiation Oncology | Admitting: Radiation Oncology

## 2024-07-26 VITALS — Resp 15 | Ht 63.0 in | Wt 148.0 lb

## 2024-07-26 DIAGNOSIS — Z923 Personal history of irradiation: Secondary | ICD-10-CM | POA: Diagnosis not present

## 2024-07-26 DIAGNOSIS — R232 Flushing: Secondary | ICD-10-CM | POA: Diagnosis not present

## 2024-07-26 DIAGNOSIS — D0512 Intraductal carcinoma in situ of left breast: Secondary | ICD-10-CM | POA: Insufficient documentation

## 2024-07-26 DIAGNOSIS — Z170 Estrogen receptor positive status [ER+]: Secondary | ICD-10-CM | POA: Insufficient documentation

## 2024-07-26 DIAGNOSIS — C50912 Malignant neoplasm of unspecified site of left female breast: Secondary | ICD-10-CM

## 2024-07-26 NOTE — Progress Notes (Signed)
 Radiation Oncology Follow up Note  Name: Ann Cox   Date:   07/26/2024 MRN:  [id number] DOB: 05-May-1976    This 48 y.o. female presents to the clinic today for 40-month follow-up status post whole breast radiation to her left breast for ER positive ductal carcinoma in situ.  REFERRING PROVIDER: No ref. provider found  HPI: Patient is a 48 year old female now at 16 months having completed whole breast radiation to her left breast for ER positive ductal carcinoma in situ.  Seen today in routine follow-up she is doing well from a breast standpoint she is having significant hot flashes recently has ran out of her prescription for estrogen patches although I have told her I would not recommend estrogen patches for patient with ER positive breast cancer..  She specifically denies breast tenderness cough or bone pain.  Patient mammograms back in March were BI-RADS 2 benign which I have reviewed  COMPLICATIONS OF TREATMENT: none  FOLLOW UP COMPLIANCE: keeps appointments   PHYSICAL EXAM:  Resp 15   Ht 5' 3 (1.6 m)   Wt 148 lb (67.1 kg)   LMP 12/15/2021 (Approximate)   BMI 26.22 kg/m  Lungs are clear to A&P cardiac examination essentially unremarkable with regular rate and rhythm. No dominant mass or nodularity is noted in either breast in 2 positions examined. Incision is well-healed. No axillary or supraclavicular adenopathy is appreciated. Cosmetic result is excellent.  Well-developed well-nourished patient in NAD. HEENT reveals PERLA, EOMI, discs not visualized.  Oral cavity is clear. No oral mucosal lesions are identified. Neck is clear without evidence of cervical or supraclavicular adenopathy. Lungs are clear to A&P. Cardiac examination is essentially unremarkable with regular rate and rhythm without murmur rub or thrill. Abdomen is benign with no organomegaly or masses noted. Motor sensory and DTR levels are equal and symmetric in the upper and lower extremities. Cranial nerves II  through XII are grossly intact. Proprioception is intact. No peripheral adenopathy or edema is identified. No motor or sensory levels are noted. Crude visual fields are within normal range.  RADIOLOGY RESULTS: Mammograms reviewed compatible with above-stated findings  PLAN: Present time patient is doing well with no evidence of disease again I have warned her that it is not recommended to be on estrogen patches in a patient with ER positive breast cancer.  I have suggested vitamin E supplements for her hot flashes.  Of asked her to contact Dr. Jacobo or her gynecologist if she still wants her estrogen refilled.  I have asked to see her back in 1 year for follow-up.  Patient knows to call with any concerns.  I would like to take this opportunity to thank you for allowing me to participate in the care of your patient.SABRA Marcey Penton, MD

## 2024-07-26 NOTE — Telephone Encounter (Signed)
 RN attempted to call patient regarding request for refill of Estradiol . Per Dr. Jacobo he will not fill this prescription. Patient should reach out to OB/GYN for refill request. Attempted to call to discuss with patient. No answer and vm is full.

## 2024-09-22 ENCOUNTER — Ambulatory Visit: Payer: PRIVATE HEALTH INSURANCE | Admitting: Surgery

## 2025-07-28 ENCOUNTER — Ambulatory Visit: Payer: PRIVATE HEALTH INSURANCE | Admitting: Radiation Oncology
